# Patient Record
Sex: Female | Born: 1935 | Race: White | Hispanic: No | Smoking: Never smoker
Health system: Southern US, Community
[De-identification: ages and names within clinical notes are randomized; demographics above are authoritative.]

## PROBLEM LIST (undated history)

## (undated) DIAGNOSIS — I2510 Atherosclerotic heart disease of native coronary artery without angina pectoris: Secondary | ICD-10-CM

## (undated) DIAGNOSIS — I639 Cerebral infarction, unspecified: Secondary | ICD-10-CM

## (undated) DIAGNOSIS — N3281 Overactive bladder: Secondary | ICD-10-CM

## (undated) DIAGNOSIS — I340 Nonrheumatic mitral (valve) insufficiency: Secondary | ICD-10-CM

## (undated) DIAGNOSIS — R55 Syncope and collapse: Secondary | ICD-10-CM

## (undated) DIAGNOSIS — K219 Gastro-esophageal reflux disease without esophagitis: Secondary | ICD-10-CM

## (undated) DIAGNOSIS — E039 Hypothyroidism, unspecified: Secondary | ICD-10-CM

## (undated) HISTORY — PX: THYROIDECTOMY, PARTIAL: SHX18

## (undated) HISTORY — DX: Nonrheumatic mitral (valve) insufficiency: I34.0

## (undated) HISTORY — PX: CORNEAL TRANSPLANT: SHX108

## (undated) HISTORY — PX: TOTAL KNEE ARTHROPLASTY: SHX125

## (undated) HISTORY — PX: APPENDECTOMY: SHX54

## (undated) HISTORY — DX: Syncope and collapse: R55

## (undated) HISTORY — DX: Atherosclerotic heart disease of native coronary artery without angina pectoris: I25.10

---

## 2016-01-07 ENCOUNTER — Telehealth: Payer: Self-pay | Admitting: Cardiology

## 2016-01-07 NOTE — Telephone Encounter (Signed)
Patient came to office and completed Release to obtain her records from Dr [facility] ([place], [place]).  Faxed to [phone].  Faxed on 01/07/16.  Patient has appointment with Dr Antoine PocheHochrein on 01/13/16.

## 2016-01-12 ENCOUNTER — Telehealth: Payer: Self-pay | Admitting: Cardiology

## 2016-01-12 NOTE — Progress Notes (Signed)
Cardiology Office Note   Date:  01/13/2016   ID:  Christina Paymentlizabeth Czerniak, DOB [birth year], MRN [id number]  PCP:  Pcp Not In System  Cardiologist:   Rollene RotundaJames Monzerrat Wellen, MD   Chief Complaint  Patient presents with  . Chest Pain      History of Present Illness: Christina Ali is a 82 y.o. female who presents for evaluation of chest pain.  She is new to this area having moved [place] a couple of months ago to be near her grandson who is an MD at [employer].  She has a history of chest pain and syncope.  I did get records from a cardiologist in another city in [place].  She had a history of chest pain apparently and had a cath in 2012.  This showed mild nonobstructive disease.  She has mild MR.  She also apparently has orthostasis and is treated for this.  She has vasovagal syncope.  She is now living at a retirement home.  She does some exercise classes and does well with this.  She does have some chest pain.  This is mild.   It last most of the day when it happens.  She describes mid chest pain that does not radiate.  She has no associated symptoms. There has been no new shortness of breath, PND or orthopnea. There have been no reported palpitations, presyncope or syncope.      Past Medical History  Diagnosis Date  . CAD (coronary artery disease)     Cath 2012 right coronary 35-40% stenosis, LAD proximal 20% stenosis followed by mid 40% stenosis  . Mitral regurgitation     Mild  . Vasovagal syncope     Past Surgical History  Procedure Laterality Date  . Thyroidectomy, partial    . Total knee arthroplasty    . Appendectomy    . Corneal transplant       Current Outpatient Prescriptions  Medication Sig Dispense Refill  . aspirin 81 MG tablet Take 81 mg by mouth daily.    . Calcium-Vitamin D 600-200 MG-UNIT tablet Take 1 tablet by mouth daily.    Marland Kitchen. levothyroxine (SYNTHROID, LEVOTHROID) 50 MCG tablet Take 50 mcg by mouth daily before breakfast.    . metoprolol tartrate (LOPRESSOR) 25 MG tablet Take 1/2  tablets in morning and 1 at night    . mirabegron ER (MYRBETRIQ) 50 MG TB24 tablet Take 50 mg by mouth daily.    . Misc Natural Products (TART CHERRY ADVANCED PO) Take 1 capsule by mouth daily.    . Multiple Vitamins-Minerals (MULTIVITAMIN WITH MINERALS) tablet Take 1 tablet by mouth daily.    . nitroGLYCERIN (NITROSTAT) 0.4 MG SL tablet Place 0.4 mg under the tongue every 5 (five) minutes as needed for chest pain.    Marland Kitchen. omeprazole (PRILOSEC) 40 MG capsule Take 40 mg by mouth daily.    . trospium (SANCTURA) 20 MG tablet Take 20 mg by mouth daily.     No current facility-administered medications for this visit.    Allergies:   Review of patient's allergies indicates no known allergies.    Social History:  The patient  reports that she has never smoked. She has never used smokeless tobacco.   Family History:  The patient's family history includes Depression in her sister; Lymphoma in her son; Stroke in her mother.    ROS:  Please see the history of present illness.   Otherwise, review of systems are positive for none.   All other systems are  reviewed and negative.    PHYSICAL EXAM: VS:  BP 110/58 mmHg  Pulse 62  Ht 5' (1.524 m)  Wt 128 lb (58.06 kg)  BMI 25.00 kg/m2 , BMI Body mass index is 25 kg/(m^2). GENERAL:  Well appearing HEENT:  Pupils equal round and reactive, fundi not visualized, oral mucosa unremarkable NECK:  No jugular venous distention, waveform within normal limits, carotid upstroke brisk and symmetric, no bruits, no thyromegaly LYMPHATICS:  No cervical, inguinal adenopathy LUNGS:  Clear to auscultation bilaterally BACK:  No CVA tenderness CHEST:  Unremarkable HEART:  PMI not displaced or sustained,S1 and S2 within normal limits, no S3, no S4, no clicks, no rubs, no murmurs ABD:  Flat, positive bowel sounds normal in frequency in pitch, no bruits, no rebound, no guarding, no midline pulsatile mass, no hepatomegaly, no splenomegaly EXT:  2 plus pulses throughout, no  edema, no cyanosis no clubbing SKIN:  No rashes no nodules NEURO:  Cranial nerves II through XII grossly intact, motor grossly intact throughout PSYCH:  Cognitively intact, oriented to person place and time    EKG:  EKG is ordered today. The ekg ordered today demonstrates NSR, rate 63, axis WNL, intervals WNL, poor anterior R wave progression.  No acute ST T wave changes.     Recent Labs: No results found for requested labs within last 365 days.    Lipid Panel No results found for: CHOL, TRIG, HDL, CHOLHDL, VLDL, LDLCALC, LDLDIRECT    Wt Readings from Last 3 Encounters:  01/13/16 128 lb (58.06 kg)      Other studies Reviewed: Additional studies/ records that were reviewed today include: Outside cardiology records.. Review of the above records demonstrates:  Please see elsewhere in the note.     ASSESSMENT AND PLAN:  CHEST PAIN:  Her pain is somewhat atypical. I don't think further cardiovascular testing is indicated. She wonders if she might need more medication during the day. I'm going to simply switch the timing of her medication so she takes a higher dose metoprolol in the morning. I need to be careful because she seems to have had a history of problems with lower blood pressure and vasovagal syncope. I'll talk with her grandson to follow-up on symptoms and I will see her back in the office.   MR:  This was mild on exam and I will follow this clinically.  Current medicines are reviewed at length with the patient today.  The patient does not have concerns regarding medicines.  The following changes have been made:  no change  Labs/ tests ordered today include:  No orders of the defined types were placed in this encounter.     Disposition:   FU with me in 4 months.      Signed, Rollene Rotunda, MD  01/13/2016 5:23 PM    [facility]

## 2016-01-12 NOTE — Telephone Encounter (Signed)
Received records from [facility] ([place], [place]) as requested on 01/07/16 for appointment on 01/13/16 with Dr Antoine PocheHochrein.  [facility] for Dr Hochrein's schedule on 01/13/16. lp

## 2016-01-13 ENCOUNTER — Ambulatory Visit (INDEPENDENT_AMBULATORY_CARE_PROVIDER_SITE_OTHER): Payer: Medicare Other | Admitting: Cardiology

## 2016-01-13 ENCOUNTER — Encounter: Payer: Self-pay | Admitting: Cardiology

## 2016-01-13 VITALS — BP 110/58 | HR 62 | Ht 60.0 in | Wt 128.0 lb

## 2016-01-13 DIAGNOSIS — R072 Precordial pain: Secondary | ICD-10-CM

## 2016-01-13 NOTE — Patient Instructions (Signed)
Your physician recommends that you schedule a follow-up appointment in: 3 Months  Your physician has recommended you make the following change in your medication: Take 12.5 mg (1/2) tablets in the morning and 1 tablets(25mg ) at night

## 2016-02-16 ENCOUNTER — Emergency Department (HOSPITAL_COMMUNITY)
Admission: EM | Admit: 2016-02-16 | Discharge: 2016-02-16 | Disposition: A | Discharge status: Home / Self Care | Payer: Medicare Other | Attending: Emergency Medicine | Admitting: Emergency Medicine

## 2016-02-16 ENCOUNTER — Emergency Department (HOSPITAL_COMMUNITY): Payer: Medicare Other

## 2016-02-16 DIAGNOSIS — I340 Nonrheumatic mitral (valve) insufficiency: Secondary | ICD-10-CM | POA: Insufficient documentation

## 2016-02-16 DIAGNOSIS — Z7982 Long term (current) use of aspirin: Secondary | ICD-10-CM | POA: Diagnosis not present

## 2016-02-16 DIAGNOSIS — R05 Cough: Secondary | ICD-10-CM | POA: Diagnosis not present

## 2016-02-16 DIAGNOSIS — Z79899 Other long term (current) drug therapy: Secondary | ICD-10-CM | POA: Insufficient documentation

## 2016-02-16 DIAGNOSIS — I2510 Atherosclerotic heart disease of native coronary artery without angina pectoris: Secondary | ICD-10-CM | POA: Diagnosis not present

## 2016-02-16 DIAGNOSIS — R059 Cough, unspecified: Secondary | ICD-10-CM

## 2016-02-16 DIAGNOSIS — R0902 Hypoxemia: Secondary | ICD-10-CM | POA: Diagnosis present

## 2016-02-16 LAB — BASIC METABOLIC PANEL
ANION GAP: 10 (ref 5–15)
BUN: 19 mg/dL (ref 6–20)
CALCIUM: 8.8 mg/dL — AB (ref 8.9–10.3)
CO2: 23 mmol/L (ref 22–32)
CREATININE: 0.77 mg/dL (ref 0.44–1.00)
Chloride: 93 mmol/L — ABNORMAL LOW (ref 101–111)
GFR calc Af Amer: >60 mL/min (ref >60)
GFR calc non Af Amer: >60 mL/min (ref >60)
GLUCOSE: 104 mg/dL — AB (ref 65–99)
Potassium: 4.3 mmol/L (ref 3.5–5.1)
Sodium: 126 mmol/L — ABNORMAL LOW (ref 135–145)

## 2016-02-16 LAB — CBC WITH DIFFERENTIAL/PLATELET
BASOS PCT: 0 %
Basophils Absolute: 0 10*3/uL (ref 0.0–0.1)
EOS PCT: 4 %
Eosinophils Absolute: 0.3 10*3/uL (ref 0.0–0.7)
HEMATOCRIT: 36.1 % (ref 36.0–46.0)
Hemoglobin: 12.3 g/dL (ref 12.0–15.0)
Lymphocytes Relative: 22 %
Lymphs Abs: 1.5 10*3/uL (ref 0.7–4.0)
MCH: 29.1 pg (ref 26.0–34.0)
MCHC: 34.1 g/dL (ref 30.0–36.0)
MCV: 85.5 fL (ref 78.0–100.0)
MONO ABS: 0.6 10*3/uL (ref 0.1–1.0)
MONOS PCT: 9 %
NEUTROS ABS: 4.4 10*3/uL (ref 1.7–7.7)
Neutrophils Relative %: 65 %
Platelets: 168 10*3/uL (ref 150–400)
RBC: 4.22 MIL/uL (ref 3.87–5.11)
RDW: 14.5 % (ref 11.5–15.5)
WBC: 6.8 10*3/uL (ref 4.0–10.5)

## 2016-02-16 LAB — I-STAT CG4 LACTIC ACID, ED: Lactic Acid, Venous: 1.35 mmol/L (ref 0.5–2.0)

## 2016-02-16 MED ORDER — IPRATROPIUM-ALBUTEROL 0.5-2.5 (3) MG/3ML IN SOLN
3.0000 mL | Freq: Once | RESPIRATORY_TRACT | Status: AC
  Administered 2016-02-16: 3 mL via RESPIRATORY_TRACT
  Filled 2016-02-16: qty 3

## 2016-02-16 MED ORDER — IPRATROPIUM-ALBUTEROL 0.5-2.5 (3) MG/3ML IN SOLN: 3.0000 mL | Freq: Four times a day (QID) | RESPIRATORY_TRACT | Status: DC | PRN

## 2016-02-16 NOTE — ED Notes (Signed)
Pt reports to the ED for eval of trapped mucous plug. Pt is from [facility]. Recently had PNA and has been having a continued productive cough. Approx occurred 1 hour ago she was coughing and a mucous plus got stuck in her throat and she has been unable to cough it up. O2 sats WNL without coughing but when coughing O2 sats decrease to 70s per EMS. They attempted suctioning but were unable to remove it. Pt A&Ox4, resp e/u at this time, skin warm and dry.

## 2016-02-16 NOTE — Discharge Planning (Signed)
Discharging patient back to [facility].  Nebulizer machine ordered.  [facility]  States will take care of it.

## 2016-02-16 NOTE — Discharge Instructions (Signed)

## 2016-02-16 NOTE — ED Notes (Signed)
Pt statesshe understands instructions. 

## 2016-02-16 NOTE — ED Notes (Signed)
Pt ambulates to [facility].

## 2016-02-16 NOTE — ED Notes (Signed)
Pt returns to bed [facility] here to transport.

## 2016-02-16 NOTE — ED Provider Notes (Signed)
CSN: [id number]     Arrival date & time 02/16/16  1140 History   First MD Initiated Contact with Patient 02/16/16 1151     Chief Complaint  Patient presents with  . Foreign Body     (Consider location/radiation/quality/duration/timing/severity/associated sxs/prior Treatment) HPI   Christina Ali is a 82 y.o. female who presents for evaluation of difficulty breathing, and an episode of hypoxia. She has apparently been coughing this morning and feels like she has some mucus, that she is unable to move into her oropharynx. She was well yesterday and had a good night sleeping last night. During transport, by EMS , her oxygen saturations were transiently in the 70s by their report. They attempted to suction her orally, but do not feel that they were able to remove anything. There is no history consistent with a food bolus obstruction. Patient is not having chest pain, nausea, vomiting, or new weakness and dizziness. She denies other recent illnesses. There are no other known modifying factors.   Past Medical History  Diagnosis Date  . CAD (coronary artery disease)     Cath 2012 right coronary 35-40% stenosis, LAD proximal 20% stenosis followed by mid 40% stenosis  . Mitral regurgitation     Mild  . Vasovagal syncope    Past Surgical History  Procedure Laterality Date  . Thyroidectomy, partial    . Total knee arthroplasty    . Appendectomy    . Corneal transplant     Family History  Problem Relation Age of Onset  . Stroke Mother   . Depression Sister   . Lymphoma Son    Social History  Substance Use Topics  . Smoking status: Never Smoker   . Smokeless tobacco: Never Used  . Alcohol Use: Not on file   OB History    No data available     Review of Systems    Allergies  Review of patient's allergies indicates no known allergies.  Home Medications   Prior to Admission medications   Medication Sig Start Date End Date Taking? Authorizing Provider  aspirin 81 MG tablet  Take 81 mg by mouth daily.   Yes Historical Provider, MD  Calcium-Vitamin D 600-200 MG-UNIT tablet Take 1 tablet by mouth daily.   Yes Historical Provider, MD  Glucos-Chond-Hyal Ac-Ca Fructo (MOVE FREE JOINT HEALTH ADVANCE PO) Take 1 tablet by mouth daily.   Yes Historical Provider, MD  guaifenesin (ROBITUSSIN) 100 MG/5ML syrup Take 200 mg by mouth 4 (four) times daily. For cough   Yes Historical Provider, MD  levothyroxine (SYNTHROID, LEVOTHROID) 50 MCG tablet Take 50 mcg by mouth daily before breakfast.   Yes Historical Provider, MD  Loratadine-Pseudoephedrine (CLARITIN-D 12 HOUR PO) Take 1 tablet by mouth daily.   Yes Historical Provider, MD  metoprolol tartrate (LOPRESSOR) 25 MG tablet Take 12.5-25 mg by mouth 2 (two) times daily. Take 1/2 tablets in morning and 1 at night   Yes Historical Provider, MD  mirabegron ER (MYRBETRIQ) 50 MG TB24 tablet Take 50 mg by mouth daily.   Yes Historical Provider, MD  omeprazole (PRILOSEC) 40 MG capsule Take 40 mg by mouth daily.   Yes Historical Provider, MD  prednisoLONE acetate (PRED MILD) 0.12 % ophthalmic suspension Place 1 drop into the right eye at bedtime.   Yes Historical Provider, MD  Prenatal Vit-Fe Fumarate-FA (MULTIVITAMIN-PRENATAL) 27-0.8 MG TABS tablet Take 1 tablet by mouth daily at 12 noon.   Yes Historical Provider, MD  trospium (SANCTURA) 20 MG tablet Take 60 mg  by mouth daily.    Yes Historical Provider, MD  ipratropium-albuterol (DUONEB) 0.5-2.5 (3) MG/3ML SOLN Take 3 mLs by nebulization every 6 (six) hours as needed (Cough or congestion). 02/16/16   Mancel BaleElliott Ferol Laiche, MD  Misc Natural Products (TART CHERRY ADVANCED PO) Take 1 capsule by mouth daily.    Historical Provider, MD  Multiple Vitamins-Minerals (MULTIVITAMIN WITH MINERALS) tablet Take 1 tablet by mouth daily.    Historical Provider, MD  nitroGLYCERIN (NITROSTAT) 0.4 MG SL tablet Place 0.4 mg under the tongue every 5 (five) minutes as needed for chest pain.    Historical Provider, MD    BP 110/76 mmHg  Pulse 78  Temp(Src) 98.1 F (36.7 C) (Oral)  Resp 18  SpO2 95% Physical Exam  Constitutional: She is oriented to person, place, and time. She appears well-developed and well-nourished.  HENT:  Head: Normocephalic and atraumatic.  Right Ear: External ear normal.  Left Ear: External ear normal.  Eyes: Conjunctivae and EOM are normal. Pupils are equal, round, and reactive to light.  Neck: Normal range of motion and phonation normal. Neck supple.  Cardiovascular: Normal rate, regular rhythm and normal heart sounds.   Pulmonary/Chest: Effort normal and breath sounds normal. No respiratory distress. She has no wheezes. She exhibits no bony tenderness.  No increased work of breathing. Symmetric and normal air movement bilaterally.  Abdominal: Soft. There is no tenderness.  Musculoskeletal: Normal range of motion. She exhibits no edema or tenderness.  Neurological: She is alert and oriented to person, place, and time. No cranial nerve deficit or sensory deficit. She exhibits normal muscle tone. Coordination normal.  No dysarthria and aphasia or nystagmus. She is lucid.  Skin: Skin is warm, dry and intact.  Psychiatric: She has a normal mood and affect. Her behavior is normal. Judgment and thought content normal.  Nursing note and vitals reviewed.   ED Course  Procedures (including critical care time)  Initial clinical impression- nonspecific transient respiratory distress associated with coughing, and a sensation of mucus plugging. This resolved in the emergency department. Possible bronchospasm-multiple potential causes, versus bronchitis, as sources. Initial evaluation with imaging, then will reassess.  Medications  ipratropium-albuterol (DUONEB) 0.5-2.5 (3) MG/3ML nebulizer solution 3 mL (3 mLs Nebulization Given 02/16/16 1309)    Patient Vitals for the past 24 hrs:  BP Temp Temp src Pulse Resp SpO2  02/16/16 1530 110/76 mmHg - - 78 18 95 %  02/16/16 1515 119/78  mmHg - - 79 21 97 %  02/16/16 1500 118/87 mmHg - - 80 18 99 %  02/16/16 1445 127/76 mmHg - - 83 20 99 %  02/16/16 1430 123/61 mmHg - - 84 20 99 %  02/16/16 1345 138/88 mmHg - - 98 21 94 %  02/16/16 1330 118/70 mmHg - - 84 14 97 %  02/16/16 1300 128/76 mmHg - - 79 18 97 %  02/16/16 1215 119/75 mmHg - - 74 19 95 %  02/16/16 1200 124/87 mmHg - - 77 20 97 %  02/16/16 1147 129/79 mmHg 98.1 F (36.7 C) Oral 79 18 100 %    1:12 PM Reevaluation with update and discussion. After initial assessment and treatment, an updated evaluation reveals She remains comfortable, saturation on room air. Findings discussed with family member, proceed with further evaluation to assess the abnormal chest x-ray, with labs. Also give trial of DuoNeb, to hopefully improve her ability to move secretions. Christina Ali    Labs Review Labs Reviewed  BASIC METABOLIC PANEL - Abnormal; Notable for  the following:    Sodium 126 (*)    Chloride 93 (*)    Glucose, Bld 104 (*)    Calcium 8.8 (*)    All other components within normal limits  CBC WITH DIFFERENTIAL/PLATELET  I-STAT CG4 LACTIC ACID, ED    Imaging Review Dg Chest 2 View  02/16/2016  CLINICAL DATA:  Cough, congestion for several weeks. Recent pneumonia last month. EXAM: CHEST  2 VIEW COMPARISON:  None. FINDINGS: Heart is normal size. Mild airspace opacity posteriorly at the left base. Right lung is clear. No effusions. No acute bony abnormality. IMPRESSION: Left lower lobe atelectasis or early infiltrate. Electronically Signed   By: Charlett Nose M.D.   On: 02/16/2016 12:37   I have personally reviewed and evaluated these images and lab results as part of my medical decision-making.   EKG Interpretation None      MDM   Final diagnoses:  Cough    Nonspecific cough, likely related to inspissated mucus. Doubt bronchitis, pneumonia, metabolic instability or impending vascular cause.  Nursing Notes Reviewed/ Care Coordinated Applicable Imaging  Reviewed Interpretation of Laboratory Data incorporated into ED treatment  The patient appears reasonably screened and/or stabilized for discharge and I doubt any other medical condition or other [facility] requiring further screening, evaluation, or treatment in the ED at this time prior to discharge.  Plan: Home Medications- DuonebQID; Home Treatments- increase oral fluids; return here if the recommended treatment, does not improve the symptoms; Recommended follow up- PCP 1 week     Mancel Bale, MD 02/16/16 1558

## 2016-02-23 ENCOUNTER — Emergency Department (HOSPITAL_COMMUNITY): Payer: Medicare Other

## 2016-02-23 ENCOUNTER — Observation Stay (HOSPITAL_COMMUNITY)
Admission: EM | Admit: 2016-02-23 | Discharge: 2016-02-24 | Disposition: A | Discharge status: Hospice | Payer: Medicare Other | Attending: Internal Medicine | Admitting: Internal Medicine

## 2016-02-23 ENCOUNTER — Encounter (HOSPITAL_COMMUNITY): Payer: Self-pay

## 2016-02-23 DIAGNOSIS — W19XXXA Unspecified fall, initial encounter: Secondary | ICD-10-CM | POA: Insufficient documentation

## 2016-02-23 DIAGNOSIS — I639 Cerebral infarction, unspecified: Secondary | ICD-10-CM | POA: Diagnosis present

## 2016-02-23 DIAGNOSIS — E871 Hypo-osmolality and hyponatremia: Secondary | ICD-10-CM | POA: Insufficient documentation

## 2016-02-23 DIAGNOSIS — Z96659 Presence of unspecified artificial knee joint: Secondary | ICD-10-CM | POA: Insufficient documentation

## 2016-02-23 DIAGNOSIS — I63511 Cerebral infarction due to unspecified occlusion or stenosis of right middle cerebral artery: Principal | ICD-10-CM | POA: Insufficient documentation

## 2016-02-23 DIAGNOSIS — I340 Nonrheumatic mitral (valve) insufficiency: Secondary | ICD-10-CM | POA: Insufficient documentation

## 2016-02-23 DIAGNOSIS — R509 Fever, unspecified: Secondary | ICD-10-CM

## 2016-02-23 DIAGNOSIS — K219 Gastro-esophageal reflux disease without esophagitis: Secondary | ICD-10-CM | POA: Diagnosis not present

## 2016-02-23 DIAGNOSIS — I63311 Cerebral infarction due to thrombosis of right middle cerebral artery: Secondary | ICD-10-CM | POA: Diagnosis not present

## 2016-02-23 DIAGNOSIS — Z515 Encounter for palliative care: Secondary | ICD-10-CM | POA: Insufficient documentation

## 2016-02-23 DIAGNOSIS — E039 Hypothyroidism, unspecified: Secondary | ICD-10-CM | POA: Diagnosis not present

## 2016-02-23 DIAGNOSIS — I2510 Atherosclerotic heart disease of native coronary artery without angina pectoris: Secondary | ICD-10-CM | POA: Insufficient documentation

## 2016-02-23 DIAGNOSIS — Z7982 Long term (current) use of aspirin: Secondary | ICD-10-CM | POA: Diagnosis not present

## 2016-02-23 DIAGNOSIS — Z66 Do not resuscitate: Secondary | ICD-10-CM | POA: Diagnosis not present

## 2016-02-23 HISTORY — DX: Overactive bladder: N32.81

## 2016-02-23 HISTORY — DX: Gastro-esophageal reflux disease without esophagitis: K21.9

## 2016-02-23 HISTORY — DX: Hypothyroidism, unspecified: E03.9

## 2016-02-23 HISTORY — DX: Cerebral infarction, unspecified: I63.9

## 2016-02-23 LAB — CBC
HCT: 39.1 % (ref 36.0–46.0)
Hemoglobin: 13.2 g/dL (ref 12.0–15.0)
MCH: 28.6 pg (ref 26.0–34.0)
MCHC: 33.8 g/dL (ref 30.0–36.0)
MCV: 84.8 fL (ref 78.0–100.0)
PLATELETS: 177 10*3/uL (ref 150–400)
RBC: 4.61 MIL/uL (ref 3.87–5.11)
RDW: 14.4 % (ref 11.5–15.5)
WBC: 6.6 10*3/uL (ref 4.0–10.5)

## 2016-02-23 LAB — I-STAT CHEM 8, ED
BUN: 11 mg/dL (ref 6–20)
CALCIUM ION: 0.99 mmol/L — AB (ref 1.13–1.30)
CHLORIDE: 92 mmol/L — AB (ref 101–111)
Creatinine, Ser: 0.6 mg/dL (ref 0.44–1.00)
Glucose, Bld: 122 mg/dL — ABNORMAL HIGH (ref 65–99)
HEMATOCRIT: 45 % (ref 36.0–46.0)
Hemoglobin: 15.3 g/dL — ABNORMAL HIGH (ref 12.0–15.0)
POTASSIUM: 3.7 mmol/L (ref 3.5–5.1)
SODIUM: 128 mmol/L — AB (ref 135–145)
TCO2: 24 mmol/L (ref 0–100)

## 2016-02-23 LAB — URINALYSIS, ROUTINE W REFLEX MICROSCOPIC
BILIRUBIN URINE: NEGATIVE
Glucose, UA: NEGATIVE mg/dL
Ketones, ur: 15 mg/dL — AB
Leukocytes, UA: NEGATIVE
Nitrite: NEGATIVE
PH: 7.5 (ref 5.0–8.0)
Protein, ur: NEGATIVE mg/dL
SPECIFIC GRAVITY, URINE: 1.009 (ref 1.005–1.030)

## 2016-02-23 LAB — DIFFERENTIAL
BASOS PCT: 0 %
Basophils Absolute: 0 10*3/uL (ref 0.0–0.1)
Eosinophils Absolute: 0 10*3/uL (ref 0.0–0.7)
Eosinophils Relative: 0 %
Lymphocytes Relative: 13 %
Lymphs Abs: 0.8 10*3/uL (ref 0.7–4.0)
MONO ABS: 0.6 10*3/uL (ref 0.1–1.0)
Monocytes Relative: 8 %
Neutro Abs: 5.2 10*3/uL (ref 1.7–7.7)
Neutrophils Relative %: 79 %

## 2016-02-23 LAB — RAPID URINE DRUG SCREEN, HOSP PERFORMED
AMPHETAMINES: NOT DETECTED
Barbiturates: NOT DETECTED
Benzodiazepines: NOT DETECTED
COCAINE: NOT DETECTED
OPIATES: NOT DETECTED
Tetrahydrocannabinol: NOT DETECTED

## 2016-02-23 LAB — COMPREHENSIVE METABOLIC PANEL
ALT: 21 U/L (ref 14–54)
AST: 48 U/L — ABNORMAL HIGH (ref 15–41)
Albumin: 4 g/dL (ref 3.5–5.0)
Alkaline Phosphatase: 61 U/L (ref 38–126)
Anion gap: 11 (ref 5–15)
BUN: 9 mg/dL (ref 6–20)
CHLORIDE: 94 mmol/L — AB (ref 101–111)
CO2: 22 mmol/L (ref 22–32)
CREATININE: 0.82 mg/dL (ref 0.44–1.00)
Calcium: 9 mg/dL (ref 8.9–10.3)
GFR calc non Af Amer: >60 mL/min (ref >60)
Glucose, Bld: 124 mg/dL — ABNORMAL HIGH (ref 65–99)
POTASSIUM: 3.8 mmol/L (ref 3.5–5.1)
SODIUM: 127 mmol/L — AB (ref 135–145)
Total Bilirubin: 1 mg/dL (ref 0.3–1.2)
Total Protein: 7.3 g/dL (ref 6.5–8.1)

## 2016-02-23 LAB — URINE MICROSCOPIC-ADD ON

## 2016-02-23 LAB — I-STAT TROPONIN, ED: Troponin i, poc: 0.08 ng/mL (ref 0.00–0.08)

## 2016-02-23 LAB — PROTIME-INR
INR: 1.14 (ref 0.00–1.49)
PROTHROMBIN TIME: 14.8 s (ref 11.6–15.2)

## 2016-02-23 LAB — APTT: APTT: 33 s (ref 24–37)

## 2016-02-23 LAB — ETHANOL

## 2016-02-23 MED ORDER — GLYCOPYRROLATE 0.2 MG/ML IJ SOLN: 0.2000 mg | INTRAMUSCULAR | Status: DC | PRN

## 2016-02-23 MED ORDER — HALOPERIDOL LACTATE 5 MG/ML IJ SOLN: 0.5000 mg | INTRAMUSCULAR | Status: DC | PRN

## 2016-02-23 MED ORDER — LORAZEPAM 2 MG/ML PO CONC: 1.0000 mg | ORAL | Status: DC | PRN

## 2016-02-23 MED ORDER — OXYBUTYNIN CHLORIDE 5 MG PO TABS: 2.5000 mg | ORAL_TABLET | Freq: Four times a day (QID) | ORAL | Status: DC | PRN

## 2016-02-23 MED ORDER — ONDANSETRON 4 MG PO TBDP: 4.0000 mg | ORAL_TABLET | Freq: Four times a day (QID) | ORAL | Status: DC | PRN

## 2016-02-23 MED ORDER — ACETAMINOPHEN 650 MG RE SUPP
650.0000 mg | Freq: Once | RECTAL | Status: AC
  Administered 2016-02-23: 650 mg via RECTAL
  Filled 2016-02-23: qty 1

## 2016-02-23 MED ORDER — WHITE PETROLATUM GEL
Status: AC
  Administered 2016-02-24: 07:00:00
  Filled 2016-02-23: qty 1

## 2016-02-23 MED ORDER — BIOTENE DRY MOUTH MT LIQD: 15.0000 mL | OROMUCOSAL | Status: DC | PRN

## 2016-02-23 MED ORDER — ONDANSETRON HCL 4 MG/2ML IJ SOLN: 4.0000 mg | Freq: Four times a day (QID) | INTRAMUSCULAR | Status: DC | PRN

## 2016-02-23 MED ORDER — LORAZEPAM 1 MG PO TABS: 1.0000 mg | ORAL_TABLET | ORAL | Status: DC | PRN

## 2016-02-23 MED ORDER — CHLORPROMAZINE HCL 25 MG/ML IJ SOLN
12.5000 mg | Freq: Four times a day (QID) | INTRAMUSCULAR | Status: DC | PRN
  Filled 2016-02-23: qty 0.5

## 2016-02-23 MED ORDER — DIPHENHYDRAMINE HCL 50 MG/ML IJ SOLN: 12.5000 mg | INTRAMUSCULAR | Status: DC | PRN

## 2016-02-23 MED ORDER — POLYVINYL ALCOHOL 1.4 % OP SOLN
1.0000 [drp] | Freq: Four times a day (QID) | OPHTHALMIC | Status: DC | PRN
  Filled 2016-02-23: qty 15

## 2016-02-23 MED ORDER — GLYCOPYRROLATE 1 MG PO TABS
1.0000 mg | ORAL_TABLET | ORAL | Status: DC | PRN
  Filled 2016-02-23: qty 1

## 2016-02-23 MED ORDER — LORAZEPAM 2 MG/ML IJ SOLN
1.0000 mg | INTRAMUSCULAR | Status: DC | PRN
  Administered 2016-02-24: 1 mg via INTRAVENOUS

## 2016-02-23 MED ORDER — LORAZEPAM 2 MG/ML IJ SOLN
1.0000 mg | INTRAMUSCULAR | Status: DC | PRN
  Filled 2016-02-23: qty 1

## 2016-02-23 MED ORDER — HALOPERIDOL LACTATE 2 MG/ML PO CONC
0.5000 mg | ORAL | Status: DC | PRN
  Filled 2016-02-23: qty 0.3

## 2016-02-23 MED ORDER — DEXTROSE-NACL 5-0.45 % IV SOLN
INTRAVENOUS | Status: DC | PRN
  Administered 2016-02-23: via INTRAVENOUS

## 2016-02-23 MED ORDER — DEXTROSE 5 % IV SOLN
1.0000 mg/h | INTRAVENOUS | Status: DC | PRN
  Filled 2016-02-23: qty 10

## 2016-02-23 MED ORDER — HALOPERIDOL 1 MG PO TABS: 0.5000 mg | ORAL_TABLET | ORAL | Status: DC | PRN

## 2016-02-23 MED ORDER — MORPHINE BOLUS VIA INFUSION
1.0000 mg | INTRAVENOUS | Status: DC | PRN
  Filled 2016-02-23: qty 4

## 2016-02-23 MED ORDER — ACETAMINOPHEN 325 MG PO TABS: 650.0000 mg | ORAL_TABLET | Freq: Four times a day (QID) | ORAL | Status: DC | PRN

## 2016-02-23 MED ORDER — ACETAMINOPHEN 650 MG RE SUPP: 650.0000 mg | Freq: Four times a day (QID) | RECTAL | Status: DC | PRN

## 2016-02-23 NOTE — Progress Notes (Signed)
Per [place] with [facility], [facility] does not a bed available today but will have one tomorrow. Per Inetta Fermoina, RN at [facility], Patient can return to facility with hospice but will need equipment in place prior to return. CSW staffed with RN CM who will consult with hospice regarding getting equipment. CSW will continue to follow.      Lance MussAshley Gardner,MSW, LCSW [facility] ED/47M Clinical Social Worker [phone]

## 2016-02-23 NOTE — ED Notes (Signed)
Pt given mouth swabs for family to wet mouth to keep her comfortable.

## 2016-02-23 NOTE — Discharge Planning (Signed)
Spoke with Kennyth ArnoldStacy of [facility] [place] [phone]); pt family [facility].  Kennyth ArnoldStacy will remain in touch with the family.

## 2016-02-23 NOTE — Discharge Planning (Signed)
EDCM spoke with Cleotis Lemaina Von Cannon of [facility] [phone]) who states they are happy to take resident back with hospice.  EDCM contacted Stacy of hospice and [facility] of [place] [phone]) to coordinate services and equipment for discharge from ED today.

## 2016-02-23 NOTE — Discharge Planning (Signed)
EDCM consulted to assist with residential hospice placement.  EDCM placed call to [facility].  Awaiting call from intake RN for next steps.

## 2016-02-23 NOTE — ED Notes (Signed)
Pt returns from CT.

## 2016-02-23 NOTE — ED Provider Notes (Signed)
CSN: [id number]     Arrival date & time 02/23/16  0710 History   First MD Initiated Contact with Patient 02/23/16 0719     Chief Complaint  Patient presents with  . Fall  . Stroke Symptoms     Level V caveat due to altered mental status. Patient is a 82 y.o. female presenting with fall. The history is provided by the patient and a relative.  Fall   patient was reportedly found [facility] the floor the nursing home. Unknown last normal time. Normally is alert and rather warranted. Recent visit to the ER she walked to the bathroom. Now she can follow some commands but can minimally speak. She appears to been incontinent of urine. She really cannot provide history.  Past Medical History  Diagnosis Date  . CAD (coronary artery disease)     Cath 2012 right coronary 35-40% stenosis, LAD proximal 20% stenosis followed by mid 40% stenosis  . Mitral regurgitation     Mild  . Vasovagal syncope   . Hypothyroidism   . Overactive bladder   . GERD (gastroesophageal reflux disease)    Past Surgical History  Procedure Laterality Date  . Thyroidectomy, partial    . Total knee arthroplasty    . Appendectomy    . Corneal transplant     Family History  Problem Relation Age of Onset  . Stroke Mother   . Depression Sister   . Lymphoma Son    Social History  Substance Use Topics  . Smoking status: Never Smoker   . Smokeless tobacco: Never Used  . Alcohol Use: None   OB History    No data available     Review of Systems  Unable to perform ROS: Mental status change      Allergies  Review of patient's allergies indicates no known allergies.  Home Medications   Prior to Admission medications   Medication Sig Start Date End Date Taking? Authorizing Provider  aspirin 81 MG tablet Take 81 mg by mouth daily.   Yes Historical Provider, MD  Calcium-Vitamin D 600-200 MG-UNIT tablet Take 1 tablet by mouth daily.   Yes Historical Provider, MD  Glucos-Chond-Hyal Ac-Ca Fructo (MOVE FREE JOINT HEALTH  ADVANCE PO) Take 1 tablet by mouth daily.   Yes Historical Provider, MD  guaifenesin (ROBITUSSIN) 100 MG/5ML syrup Take 200 mg by mouth 4 (four) times daily. For cough   Yes Historical Provider, MD  ipratropium-albuterol (DUONEB) 0.5-2.5 (3) MG/3ML SOLN Take 3 mLs by nebulization every 6 (six) hours as needed (Cough or congestion). 02/16/16  Yes Mancel Bale, MD  levothyroxine (SYNTHROID, LEVOTHROID) 50 MCG tablet Take 50 mcg by mouth daily before breakfast.   Yes Historical Provider, MD  Loratadine-Pseudoephedrine (CLARITIN-D 12 HOUR PO) Take 1 tablet by mouth daily.   Yes Historical Provider, MD  metoprolol tartrate (LOPRESSOR) 25 MG tablet Take 12.5-25 mg by mouth 2 (two) times daily. Take 1/2 tablets in morning and 1 at night   Yes Historical Provider, MD  mirabegron ER (MYRBETRIQ) 50 MG TB24 tablet Take 50 mg by mouth daily.   Yes Historical Provider, MD  Multiple Vitamins-Minerals (MULTIVITAMIN WITH MINERALS) tablet Take 1 tablet by mouth daily.   Yes Historical Provider, MD  nitroGLYCERIN (NITROSTAT) 0.4 MG SL tablet Place 0.4 mg under the tongue every 5 (five) minutes as needed for chest pain.   Yes Historical Provider, MD  omeprazole (PRILOSEC) 40 MG capsule Take 40 mg by mouth daily.   Yes Historical Provider, MD  prednisoLONE acetate (  PRED MILD) 0.12 % ophthalmic suspension Place 1 drop into the right eye at bedtime.   Yes Historical Provider, MD  Prenatal Vit-Fe Fumarate-FA (MULTIVITAMIN-PRENATAL) 27-0.8 MG TABS tablet Take 1 tablet by mouth daily at 12 noon.   Yes Historical Provider, MD  trospium (SANCTURA) 20 MG tablet Take 60 mg by mouth daily.    Yes Historical Provider, MD   BP 130/80 mmHg  Pulse 92  Temp(Src) 98.1 F (36.7 C) (Axillary)  Resp 24  Ht 5\' 2"  (1.575 m)  Wt 130 lb (58.968 kg)  BMI 23.77 kg/m2  SpO2 97% Physical Exam  Constitutional: She appears well-developed.  HENT:  Head: Atraumatic.  Mucous membranes are dry.  Eyes:  Pupils are somewhat reactive but eyes  appear deviated to the right. She will look to the midline but I have not seen across to the left side.  Neck:  Cervical collar in place. No midline tenderness.  Cardiovascular:  Mild tachycardia  Pulmonary/Chest: No respiratory distress.  Abdominal: There is no tenderness.  Musculoskeletal: She exhibits no edema.  Neurological:  Patient's been incontinent of urine. I still see deviated to right. Appears to have some left-sided neglect. Will squeeze to command and wiggle toes on right side. Right side somewhat spasmed. Patient has very slurred speech cannot understand her.  Skin: Skin is warm. No erythema.    ED Course  Procedures (including critical care time) Labs Review Labs Reviewed  COMPREHENSIVE METABOLIC PANEL - Abnormal; Notable for the following:    Sodium 127 (*)    Chloride 94 (*)    Glucose, Bld 124 (*)    AST 48 (*)    All other components within normal limits  URINALYSIS, ROUTINE W REFLEX MICROSCOPIC (NOT AT [facility]) - Abnormal; Notable for the following:    Hgb urine dipstick MODERATE (*)    Ketones, ur 15 (*)    All other components within normal limits  URINE MICROSCOPIC-ADD ON - Abnormal; Notable for the following:    Squamous Epithelial / LPF 0-5 (*)    Bacteria, UA RARE (*)    All other components within normal limits  I-STAT CHEM 8, ED - Abnormal; Notable for the following:    Sodium 128 (*)    Chloride 92 (*)    Glucose, Bld 122 (*)    Calcium, Ion 0.99 (*)    Hemoglobin 15.3 (*)    All other components within normal limits  ETHANOL  PROTIME-INR  APTT  CBC  DIFFERENTIAL  URINE RAPID DRUG SCREEN, HOSP PERFORMED  I-STAT TROPOININ, ED    Imaging Review Ct Head Wo Contrast  02/23/2016  CLINICAL DATA:  Pain following fall.  Chronic altered mental status EXAM: CT HEAD WITHOUT CONTRAST CT CERVICAL SPINE WITHOUT CONTRAST TECHNIQUE: Multidetector CT imaging of the head and cervical spine was performed following the standard protocol without intravenous  contrast. Multiplanar CT image reconstructions of the cervical spine were also generated. COMPARISON:  None. FINDINGS: CT HEAD FINDINGS There is mild diffuse atrophy. There is decreased attenuation throughout the inferior posterior right frontal lobe and essentially the entire right temporal lobe consistent with an acute middle cerebral artery distribution infarct. There is decreased attenuation is well throughout the lateral right basal ganglia region including the right claustrum, extreme capsule, and insular cortex. Localized cytotoxic edema is noted in these regions. There is no mass lesion evident. No hemorrhage or midline shift. No subdural or epidural fluid collections are evident. Elsewhere there is small vessel disease in the centra semiovale bilaterally. The  bony calvarium appears intact. The mastoid air cells are clear. There is opacification in the right frontal sinus superiorly as well as mild mucosal thickening in several ethmoid air cells bilaterally. Note that there is diffuse increased attenuation in the right middle cerebral artery compared to the left. CT CERVICAL SPINE FINDINGS There is no demonstrable fracture. There is slight anterolisthesis of C4 on C5, C6 on C7, and C7 on T1, likely due to underlying spondylosis. Mild spondylolisthesis at T1-2 and T2-3 are also felt to be due to underlying spondylosis. There is moderate disc space narrowing at C4-5 and C5-6 with severe narrowing at C6-7, C7-T1, and T1-2. There is facet osteoarthritic change and hypertrophy at most levels bilaterally. Exit foraminal narrowing is fairly marked at C4-5 on the left, C5-6 bilaterally, and C6-7 bilaterally. There are foci of calcification in both carotid arteries, more on the left than on the right. There is scarring in each lung apex. IMPRESSION: CT head: Acute infarct involving much of the right middle cerebral artery distribution. No acute hemorrhage is evident. There is atrophy with small vessel disease. No  extra-axial fluid collection or midline shift. CT cervical spine: No fracture. Mild spondylolisthesis at multiple levels, felt to be due to extensive underlying spondylosis. There is multilevel spondylosis and osteoarthritic change. There are foci of carotid artery calcification bilaterally, more severe on the left than on the right. Electronically Signed   By: Bretta Bang III M.D.   On: 02/23/2016 08:41   Ct Cervical Spine Wo Contrast  02/23/2016  CLINICAL DATA:  Pain following fall.  Chronic altered mental status EXAM: CT HEAD WITHOUT CONTRAST CT CERVICAL SPINE WITHOUT CONTRAST TECHNIQUE: Multidetector CT imaging of the head and cervical spine was performed following the standard protocol without intravenous contrast. Multiplanar CT image reconstructions of the cervical spine were also generated. COMPARISON:  None. FINDINGS: CT HEAD FINDINGS There is mild diffuse atrophy. There is decreased attenuation throughout the inferior posterior right frontal lobe and essentially the entire right temporal lobe consistent with an acute middle cerebral artery distribution infarct. There is decreased attenuation is well throughout the lateral right basal ganglia region including the right claustrum, extreme capsule, and insular cortex. Localized cytotoxic edema is noted in these regions. There is no mass lesion evident. No hemorrhage or midline shift. No subdural or epidural fluid collections are evident. Elsewhere there is small vessel disease in the centra semiovale bilaterally. The bony calvarium appears intact. The mastoid air cells are clear. There is opacification in the right frontal sinus superiorly as well as mild mucosal thickening in several ethmoid air cells bilaterally. Note that there is diffuse increased attenuation in the right middle cerebral artery compared to the left. CT CERVICAL SPINE FINDINGS There is no demonstrable fracture. There is slight anterolisthesis of C4 on C5, C6 on C7, and C7 on T1,  likely due to underlying spondylosis. Mild spondylolisthesis at T1-2 and T2-3 are also felt to be due to underlying spondylosis. There is moderate disc space narrowing at C4-5 and C5-6 with severe narrowing at C6-7, C7-T1, and T1-2. There is facet osteoarthritic change and hypertrophy at most levels bilaterally. Exit foraminal narrowing is fairly marked at C4-5 on the left, C5-6 bilaterally, and C6-7 bilaterally. There are foci of calcification in both carotid arteries, more on the left than on the right. There is scarring in each lung apex. IMPRESSION: CT head: Acute infarct involving much of the right middle cerebral artery distribution. No acute hemorrhage is evident. There is atrophy with small vessel  disease. No extra-axial fluid collection or midline shift. CT cervical spine: No fracture. Mild spondylolisthesis at multiple levels, felt to be due to extensive underlying spondylosis. There is multilevel spondylosis and osteoarthritic change. There are foci of carotid artery calcification bilaterally, more severe on the left than on the right. Electronically Signed   By: Bretta Bang III M.D.   On: 02/23/2016 08:41   Dg Chest Portable 1 View  02/23/2016  CLINICAL DATA:  Patient found in for bus side bed. Altered mental status. EXAM: PORTABLE CHEST 1 VIEW COMPARISON:  02/16/2016 FINDINGS: The heart size appears normal. Aortic atherosclerosis is identified. Small left pleural effusion is identified. Mild interstitial edema is noted. IMPRESSION: 1. Small left pleural effusion and mild edema suspicious for CHF. Electronically Signed   By: Signa Kell M.D.   On: 02/23/2016 08:16   I have personally reviewed and evaluated these images and lab results as part of my medical decision-making.   EKG Interpretation   Date/Time:  Wednesday Feb 23 2016 07:31:08 EDT Ventricular Rate:  110 PR Interval:  35 QRS Duration: 103 QT Interval:  347 QTC Calculation: 469 R Axis:   94 Text Interpretation:  Sinus  tachycardia Anteroseptal infarct, age  indeterminate Lateral leads are also involved Artifact in lead(s) I III  aVR aVL aVF V1 V2 Confirmed by Rubin Payor  MD, Hayde Kilgour [phone]) on 02/23/2016  7:35:32 AM      MDM   Final diagnoses:  Cerebrovascular accident (CVA) due to occlusion of right middle cerebral artery (HCC)    Patient with altered mental status and left-sided deficits. Likely stroke last night. Not TPA candidate due to unknown time of onset and severe deficits. Vision is overall poor prognosis. Discussed with patient's son and daughter-in-law and grandson, who is a physician at this hospital. Patient was made a DO NOT RESUSCITATE. Admit to internal medicine. Patient did have fever however did have urine that showed no infection and negative chest x-ray.      Benjiman Core, MD 02/23/16 [phone]

## 2016-02-23 NOTE — Consult Note (Signed)
Requesting Physician: Dr. Rubin Payor    Chief Complaint: stroke    HPI:                                                                                                                                         Florabel Faulks is an 82 y.o. female presenting to the [facility] after being found on the floor at her Assisted Living facility, [facility].  Her son reports the last time her say her at her baseline, normal speech and walking with a cane/walker, was yesterday early am.  She was found this morning with garbled speech and Lt side weakness.  Date last known well: Yesterday Time last known well: Time: 08:00 tPA Given: No: Outside window for acute treatment    Past Medical History  Diagnosis Date  . CAD (coronary artery disease)     Cath 2012 right coronary 35-40% stenosis, LAD proximal 20% stenosis followed by mid 40% stenosis  . Mitral regurgitation     Mild  . Vasovagal syncope     Past Surgical History  Procedure Laterality Date  . Thyroidectomy, partial    . Total knee arthroplasty    . Appendectomy    . Corneal transplant      Family History  Problem Relation Age of Onset  . Stroke Mother   . Depression Sister   . Lymphoma Son    Social History:  reports that she has never smoked. She has never used smokeless tobacco. Her alcohol and drug histories are not on file.  Allergies: No Known Allergies  Medications:                                                                                                                           No current facility-administered medications for this encounter.   Current Outpatient Prescriptions  Medication Sig Dispense Refill  . aspirin 81 MG tablet Take 81 mg by mouth daily.    . Calcium-Vitamin D 600-200 MG-UNIT tablet Take 1 tablet by mouth daily.    . Glucos-Chond-Hyal Ac-Ca Fructo (MOVE FREE JOINT HEALTH ADVANCE PO) Take 1 tablet by mouth daily.    Marland Kitchen guaifenesin (ROBITUSSIN) 100 MG/5ML syrup Take 200 mg by mouth 4 (four)  times daily. For cough    . ipratropium-albuterol (DUONEB) 0.5-2.5 (3) MG/3ML SOLN Take 3 mLs by nebulization every 6 (six) hours as needed (Cough  or congestion). 360 mL 3  . levothyroxine (SYNTHROID, LEVOTHROID) 50 MCG tablet Take 50 mcg by mouth daily before breakfast.    . Loratadine-Pseudoephedrine (CLARITIN-D 12 HOUR PO) Take 1 tablet by mouth daily.    . metoprolol tartrate (LOPRESSOR) 25 MG tablet Take 12.5-25 mg by mouth 2 (two) times daily. Take 1/2 tablets in morning and 1 at night    . mirabegron ER (MYRBETRIQ) 50 MG TB24 tablet Take 50 mg by mouth daily.    . Multiple Vitamins-Minerals (MULTIVITAMIN WITH MINERALS) tablet Take 1 tablet by mouth daily.    . nitroGLYCERIN (NITROSTAT) 0.4 MG SL tablet Place 0.4 mg under the tongue every 5 (five) minutes as needed for chest pain.    Marland Kitchen omeprazole (PRILOSEC) 40 MG capsule Take 40 mg by mouth daily.    . prednisoLONE acetate (PRED MILD) 0.12 % ophthalmic suspension Place 1 drop into the right eye at bedtime.    . Prenatal Vit-Fe Fumarate-FA (MULTIVITAMIN-PRENATAL) 27-0.8 MG TABS tablet Take 1 tablet by mouth daily at 12 noon.    . trospium (SANCTURA) 20 MG tablet Take 60 mg by mouth daily.       ROS:                                                                                                                                       History unable to be obtained due to AMS.   Neurologic Examination:                                                                                                      Blood pressure 117/72, pulse 101, temperature 101.4 F (38.6 C), temperature source Rectal, resp. rate 27, height 5\' 2"  (1.575 m), weight 58.968 kg (130 lb), SpO2 93 %.  HEENT-  Normocephalic,    Normal external eye and conjunctiva. Blood on lips and small laceration to tip of tongue Cardiovascular- S1, S2 normal, pulses palpable throughout   Lungs- tachypnea is noted, lungs diminished Abdomen- soft, non-tender; bowel sounds normal; no masses,   no organomegaly Extremities- less then 2 second capillary refill Lymph-no adenopathy palpable Musculoskeletal-no joint tenderness, deformity or swelling Skin-warm and dry, no hyperpigmentation, vitiligo, or suspicious lesions  Neurological Examination Mental Status: Eyes open and attempts at times to follow commands, such as sticking tongue out and attempting to talk. Cranial Nerves: II: No blink to threat on left pupils equal, round, reactive to light and accommodation III,IV, VI: Rt gaze preference does not cross midline  V,VII: face symmetric, no reaction to pain VIII:Unable to assess IX,X: uvula rises symmetrically, gag intact [id number] to assess XII: midline tongue extension Motor: Right : Upper extremity   3/5    Left:     Upper extremity   Held in flexion  Lower extremity   2/5     Lower extremity   Held in extension  Sensory: Withdrawal from pain RLE Deep Tendon Reflexes: 2+ and symmetric throughout UE bilaterally, 2+ RLE, 1+ LLE Plantars: mute Cerebellar: Unable to assess Gait: not tested       Lab Results: Basic Metabolic Panel:  Recent Labs Lab 02/16/16 1313 02/23/16 0748 02/23/16 0757  NA 126* 127* 128*  K 4.3 3.8 3.7  CL 93* 94* 92*  CO2 23 22  --   GLUCOSE 104* 124* 122*  BUN 19 9 11   CREATININE 0.77 0.82 0.60  CALCIUM 8.8* 9.0  --     Liver Function Tests:  Recent Labs Lab 02/23/16 0748  AST 48*  ALT 21  ALKPHOS 61  BILITOT 1.0  PROT 7.3  ALBUMIN 4.0   No results for input(s): LIPASE, AMYLASE in the last 168 hours. No results for input(s): AMMONIA in the last 168 hours.  CBC:  Recent Labs Lab 02/16/16 1313 02/23/16 0748 02/23/16 0757  WBC 6.8 6.6  --   NEUTROABS 4.4 5.2  --   HGB 12.3 13.2 15.3*  HCT 36.1 39.1 45.0  MCV 85.5 84.8  --   PLT 168 177  --     Cardiac Enzymes: No results for input(s): CKTOTAL, CKMB, CKMBINDEX, TROPONINI in the last 168 hours.  Lipid Panel: No results for input(s): CHOL, TRIG, HDL, CHOLHDL,  VLDL, LDLCALC in the last 168 hours.  CBG: No results for input(s): GLUCAP in the last 168 hours.  Microbiology: No results found for this or any previous visit.  Coagulation Studies:  Recent Labs  02/23/16 0748  LABPROT 14.8  INR 1.14    Imaging: Ct Head Wo Contrast  02/23/2016  CLINICAL DATA:  Pain following fall.  Chronic altered mental status EXAM: CT HEAD WITHOUT CONTRAST CT CERVICAL SPINE WITHOUT CONTRAST TECHNIQUE: Multidetector CT imaging of the head and cervical spine was performed following the standard protocol without intravenous contrast. Multiplanar CT image reconstructions of the cervical spine were also generated. COMPARISON:  None. FINDINGS: CT HEAD FINDINGS There is mild diffuse atrophy. There is decreased attenuation throughout the inferior posterior right frontal lobe and essentially the entire right temporal lobe consistent with an acute middle cerebral artery distribution infarct. There is decreased attenuation is well throughout the lateral right basal ganglia region including the right claustrum, extreme capsule, and insular cortex. Localized cytotoxic edema is noted in these regions. There is no mass lesion evident. No hemorrhage or midline shift. No subdural or epidural fluid collections are evident. Elsewhere there is small vessel disease in the centra semiovale bilaterally. The bony calvarium appears intact. The mastoid air cells are clear. There is opacification in the right frontal sinus superiorly as well as mild mucosal thickening in several ethmoid air cells bilaterally. Note that there is diffuse increased attenuation in the right middle cerebral artery compared to the left. CT CERVICAL SPINE FINDINGS There is no demonstrable fracture. There is slight anterolisthesis of C4 on C5, C6 on C7, and C7 on T1, likely due to underlying spondylosis. Mild spondylolisthesis at T1-2 and T2-3 are also felt to be due to underlying spondylosis. There is moderate disc space  narrowing at C4-5 and C5-6 with  severe narrowing at C6-7, C7-T1, and T1-2. There is facet osteoarthritic change and hypertrophy at most levels bilaterally. Exit foraminal narrowing is fairly marked at C4-5 on the left, C5-6 bilaterally, and C6-7 bilaterally. There are foci of calcification in both carotid arteries, more on the left than on the right. There is scarring in each lung apex. IMPRESSION: CT head: Acute infarct involving much of the right middle cerebral artery distribution. No acute hemorrhage is evident. There is atrophy with small vessel disease. No extra-axial fluid collection or midline shift. CT cervical spine: No fracture. Mild spondylolisthesis at multiple levels, felt to be due to extensive underlying spondylosis. There is multilevel spondylosis and osteoarthritic change. There are foci of carotid artery calcification bilaterally, more severe on the left than on the right. Electronically Signed   By: Bretta Bang III M.D.   On: 02/23/2016 08:41   Ct Cervical Spine Wo Contrast  02/23/2016  CLINICAL DATA:  Pain following fall.  Chronic altered mental status EXAM: CT HEAD WITHOUT CONTRAST CT CERVICAL SPINE WITHOUT CONTRAST TECHNIQUE: Multidetector CT imaging of the head and cervical spine was performed following the standard protocol without intravenous contrast. Multiplanar CT image reconstructions of the cervical spine were also generated. COMPARISON:  None. FINDINGS: CT HEAD FINDINGS There is mild diffuse atrophy. There is decreased attenuation throughout the inferior posterior right frontal lobe and essentially the entire right temporal lobe consistent with an acute middle cerebral artery distribution infarct. There is decreased attenuation is well throughout the lateral right basal ganglia region including the right claustrum, extreme capsule, and insular cortex. Localized cytotoxic edema is noted in these regions. There is no mass lesion evident. No hemorrhage or midline shift. No  subdural or epidural fluid collections are evident. Elsewhere there is small vessel disease in the centra semiovale bilaterally. The bony calvarium appears intact. The mastoid air cells are clear. There is opacification in the right frontal sinus superiorly as well as mild mucosal thickening in several ethmoid air cells bilaterally. Note that there is diffuse increased attenuation in the right middle cerebral artery compared to the left. CT CERVICAL SPINE FINDINGS There is no demonstrable fracture. There is slight anterolisthesis of C4 on C5, C6 on C7, and C7 on T1, likely due to underlying spondylosis. Mild spondylolisthesis at T1-2 and T2-3 are also felt to be due to underlying spondylosis. There is moderate disc space narrowing at C4-5 and C5-6 with severe narrowing at C6-7, C7-T1, and T1-2. There is facet osteoarthritic change and hypertrophy at most levels bilaterally. Exit foraminal narrowing is fairly marked at C4-5 on the left, C5-6 bilaterally, and C6-7 bilaterally. There are foci of calcification in both carotid arteries, more on the left than on the right. There is scarring in each lung apex. IMPRESSION: CT head: Acute infarct involving much of the right middle cerebral artery distribution. No acute hemorrhage is evident. There is atrophy with small vessel disease. No extra-axial fluid collection or midline shift. CT cervical spine: No fracture. Mild spondylolisthesis at multiple levels, felt to be due to extensive underlying spondylosis. There is multilevel spondylosis and osteoarthritic change. There are foci of carotid artery calcification bilaterally, more severe on the left than on the right. Electronically Signed   By: Bretta Bang III M.D.   On: 02/23/2016 08:41   Dg Chest Portable 1 View  02/23/2016  CLINICAL DATA:  Patient found in for bus side bed. Altered mental status. EXAM: PORTABLE CHEST 1 VIEW COMPARISON:  02/16/2016 FINDINGS: The heart size appears normal. Aortic atherosclerosis  is  identified. Small left pleural effusion is identified. Mild interstitial edema is noted. IMPRESSION: 1. Small left pleural effusion and mild edema suspicious for CHF. Electronically Signed   By: Signa Kellaylor  Stroud M.D.   On: 02/23/2016 08:16       Assessment and plan discussed with with attending physician and they are in agreement.    Felicie MornDavid Bonnie Overdorf PA-C [facility] [phone]  02/23/2016, 9:55 AM   Assessment: 82 y.o. female presenting with Rt MCA infarct.  Family has decoded to make her palliative.  At this time no further neurological work-up needed.  Call if needed  Stroke Risk Factors - hypertension

## 2016-02-23 NOTE — ED Notes (Signed)
Pt arrives EMS from NH where she was found down beside her bed. NH staff states new onset slurred speech with unknown last time seen normal. Pt has very dry mucous membranes and mumbled speech.

## 2016-02-23 NOTE — H&P (Signed)
History and Physical    Christina Paymentlizabeth Laverdure XLK:[id number] DOB: 1923-03-04 DOA: 02/23/2016  Referring MD/NP/PA: Dr Rubin PayorPickering - [facility] PCP: Pcp Not In System  Outpatient Specialists: None Patient coming from: NH  Chief Complaint: L sided paralysis and found down  HPI: Christina Paymentlizabeth Taboada is a 82 y.o. female with medical history significant of CAD, MR, Hypothyroid, presenting after being found [facility] w/ L sided paralysis. Low 5 caveat applies as patient presenting an obtunded state and unable to provide any history. At this point time patient follows only basic commands. Per report by EDP, nursing home staff and family patient was found down next to her bed this morning. Last known normal was the night before. Patient was incontinent of urine. Patient was weak in her left upper and lower extremities and unable to verbalize. Of note patient was seen and ED one week ago for hypoxia with was thought to be due to a mucous plug. Patient has otherwise been in her normal state of health which has been gradual physical decline. At baseline patient is able to angulate on her own and perform few daily functions.   ED Course: CT showing massive R MCA stroke. Noted to be febrile. UA normal. CXR with small pleural effusions but no overt pneumonia.  Review of Systems: As per HPI otherwise 10 point review of systems negative.   Past Medical History  Diagnosis Date  . CAD (coronary artery disease)     Cath 2012 right coronary 35-40% stenosis, LAD proximal 20% stenosis followed by mid 40% stenosis  . Mitral regurgitation     Mild  . Vasovagal syncope   . Hypothyroidism   . Overactive bladder   . GERD (gastroesophageal reflux disease)     Past Surgical History  Procedure Laterality Date  . Thyroidectomy, partial    . Total knee arthroplasty    . Appendectomy    . Corneal transplant       reports that she has never smoked. She has never used smokeless tobacco. Her alcohol and drug histories are not on  file.  No Known Allergies  Family History  Problem Relation Age of Onset  . Stroke Mother   . Depression Sister   . Lymphoma Son     Prior to Admission medications   Medication Sig Start Date End Date Taking? Authorizing Provider  aspirin 81 MG tablet Take 81 mg by mouth daily.   Yes Historical Provider, MD  Calcium-Vitamin D 600-200 MG-UNIT tablet Take 1 tablet by mouth daily.   Yes Historical Provider, MD  Glucos-Chond-Hyal Ac-Ca Fructo (MOVE FREE JOINT HEALTH ADVANCE PO) Take 1 tablet by mouth daily.   Yes Historical Provider, MD  guaifenesin (ROBITUSSIN) 100 MG/5ML syrup Take 200 mg by mouth 4 (four) times daily. For cough   Yes Historical Provider, MD  ipratropium-albuterol (DUONEB) 0.5-2.5 (3) MG/3ML SOLN Take 3 mLs by nebulization every 6 (six) hours as needed (Cough or congestion). 02/16/16  Yes Mancel BaleElliott Wentz, MD  levothyroxine (SYNTHROID, LEVOTHROID) 50 MCG tablet Take 50 mcg by mouth daily before breakfast.   Yes Historical Provider, MD  Loratadine-Pseudoephedrine (CLARITIN-D 12 HOUR PO) Take 1 tablet by mouth daily.   Yes Historical Provider, MD  metoprolol tartrate (LOPRESSOR) 25 MG tablet Take 12.5-25 mg by mouth 2 (two) times daily. Take 1/2 tablets in morning and 1 at night   Yes Historical Provider, MD  mirabegron ER (MYRBETRIQ) 50 MG TB24 tablet Take 50 mg by mouth daily.   Yes Historical Provider, MD  Multiple  Vitamins-Minerals (MULTIVITAMIN WITH MINERALS) tablet Take 1 tablet by mouth daily.   Yes Historical Provider, MD  nitroGLYCERIN (NITROSTAT) 0.4 MG SL tablet Place 0.4 mg under the tongue every 5 (five) minutes as needed for chest pain.   Yes Historical Provider, MD  omeprazole (PRILOSEC) 40 MG capsule Take 40 mg by mouth daily.   Yes Historical Provider, MD  prednisoLONE acetate (PRED MILD) 0.12 % ophthalmic suspension Place 1 drop into the right eye at bedtime.   Yes Historical Provider, MD  Prenatal Vit-Fe Fumarate-FA (MULTIVITAMIN-PRENATAL) 27-0.8 MG TABS tablet  Take 1 tablet by mouth daily at 12 noon.   Yes Historical Provider, MD  trospium (SANCTURA) 20 MG tablet Take 60 mg by mouth daily.    Yes Historical Provider, MD    Physical Exam: Filed Vitals:   02/23/16 1015 02/23/16 1030 02/23/16 1058 02/23/16 1100  BP: 114/69 119/60 114/69 119/68  Pulse: 101 82 105 110  Temp:      TempSrc:      Resp: 20 20 22 24   Height:      Weight:      SpO2: 96% 96% 95% 92%      Constitutional: Resting in bed. Minimally arousable Filed Vitals:   02/23/16 1015 02/23/16 1030 02/23/16 1058 02/23/16 1100  BP: 114/69 119/60 114/69 119/68  Pulse: 101 82 105 110  Temp:      TempSrc:      Resp: 20 20 22 24   Height:      Weight:      SpO2: 96% 96% 95% 92%   Eyes: *Rightward gaze, pupils reactive. ENMT: Very dry mucous membranes with poor dentition. Neck: No thyromegaly, no JVD Respiratory: Increased respiratory effort, decreased breath sounds in bases bilaterally with few crackles, no wheezing. Cardiovascular: Difficult to appreciate cardiac sounds, regular rate and rhythm, 2/6 systolic murmur, no lower extremity edema, 2+ distal pulses  Abdomen:  no tenderness, no masses palpated. Bowel sounds positive.  Musculoskeletal: Bilateral feet in plantar flexion, contracture of the left upper extremity in fixed flexion, right upper and lower extremities weak but will move. Skin:  no rashes, lesions, ulcers. No induration Neurologic/Psych: Unable to fully assess due to pts current status., LUE and LLE in contracture. Gaze rightward but will not cross MIdline. Non-conversive   Labs on Admission: I have personally reviewed following labs and imaging studies  CBC:  Recent Labs Lab 02/16/16 1313 02/23/16 0748 02/23/16 0757  WBC 6.8 6.6  --   NEUTROABS 4.4 5.2  --   HGB 12.3 13.2 15.3*  HCT 36.1 39.1 45.0  MCV 85.5 84.8  --   PLT 168 177  --    Basic Metabolic Panel:  Recent Labs Lab 02/16/16 1313 02/23/16 0748 02/23/16 0757  NA 126* 127* 128*  K  4.3 3.8 3.7  CL 93* 94* 92*  CO2 23 22  --   GLUCOSE 104* 124* 122*  BUN 19 9 11   CREATININE 0.77 0.82 0.60  CALCIUM 8.8* 9.0  --    GFR: Estimated Creatinine Clearance: 37 mL/min (by C-G formula based on Cr of 0.6). Liver Function Tests:  Recent Labs Lab 02/23/16 0748  AST 48*  ALT 21  ALKPHOS 61  BILITOT 1.0  PROT 7.3  ALBUMIN 4.0   No results for input(s): LIPASE, AMYLASE in the last 168 hours. No results for input(s): AMMONIA in the last 168 hours. Coagulation Profile:  Recent Labs Lab 02/23/16 0748  INR 1.14   Cardiac Enzymes: No results for input(s): CKTOTAL, CKMB,  CKMBINDEX, TROPONINI in the last 168 hours. BNP (last 3 results) No results for input(s): PROBNP in the last 8760 hours. HbA1C: No results for input(s): HGBA1C in the last 72 hours. CBG: No results for input(s): GLUCAP in the last 168 hours. Lipid Profile: No results for input(s): CHOL, HDL, LDLCALC, TRIG, CHOLHDL, LDLDIRECT in the last 72 hours. Thyroid Function Tests: No results for input(s): TSH, T4TOTAL, FREET4, T3FREE, THYROIDAB in the last 72 hours. Anemia Panel: No results for input(s): VITAMINB12, FOLATE, FERRITIN, TIBC, IRON, RETICCTPCT in the last 72 hours. Urine analysis:    Component Value Date/Time   COLORURINE YELLOW 02/23/2016 0830   APPEARANCEUR CLEAR 02/23/2016 0830   LABSPEC 1.009 02/23/2016 0830   PHURINE 7.5 02/23/2016 0830   GLUCOSEU NEGATIVE 02/23/2016 0830   HGBUR MODERATE* 02/23/2016 0830   BILIRUBINUR NEGATIVE 02/23/2016 0830   KETONESUR 15* 02/23/2016 0830   PROTEINUR NEGATIVE 02/23/2016 0830   NITRITE NEGATIVE 02/23/2016 0830   LEUKOCYTESUR NEGATIVE 02/23/2016 0830    Creatinine Clearance: Estimated Creatinine Clearance: 37 mL/min (by C-G formula based on Cr of 0.6).  Sepsis Labs: @LABRCNTIP (procalcitonin:4,lacticidven:4) )No results found for this or any previous visit (from the past 240 hour(s)).   Radiological Exams on Admission: Ct Head Wo  Contrast  02/23/2016  CLINICAL DATA:  Pain following fall.  Chronic altered mental status EXAM: CT HEAD WITHOUT CONTRAST CT CERVICAL SPINE WITHOUT CONTRAST TECHNIQUE: Multidetector CT imaging of the head and cervical spine was performed following the standard protocol without intravenous contrast. Multiplanar CT image reconstructions of the cervical spine were also generated. COMPARISON:  None. FINDINGS: CT HEAD FINDINGS There is mild diffuse atrophy. There is decreased attenuation throughout the inferior posterior right frontal lobe and essentially the entire right temporal lobe consistent with an acute middle cerebral artery distribution infarct. There is decreased attenuation is well throughout the lateral right basal ganglia region including the right claustrum, extreme capsule, and insular cortex. Localized cytotoxic edema is noted in these regions. There is no mass lesion evident. No hemorrhage or midline shift. No subdural or epidural fluid collections are evident. Elsewhere there is small vessel disease in the centra semiovale bilaterally. The bony calvarium appears intact. The mastoid air cells are clear. There is opacification in the right frontal sinus superiorly as well as mild mucosal thickening in several ethmoid air cells bilaterally. Note that there is diffuse increased attenuation in the right middle cerebral artery compared to the left. CT CERVICAL SPINE FINDINGS There is no demonstrable fracture. There is slight anterolisthesis of C4 on C5, C6 on C7, and C7 on T1, likely due to underlying spondylosis. Mild spondylolisthesis at T1-2 and T2-3 are also felt to be due to underlying spondylosis. There is moderate disc space narrowing at C4-5 and C5-6 with severe narrowing at C6-7, C7-T1, and T1-2. There is facet osteoarthritic change and hypertrophy at most levels bilaterally. Exit foraminal narrowing is fairly marked at C4-5 on the left, C5-6 bilaterally, and C6-7 bilaterally. There are foci of  calcification in both carotid arteries, more on the left than on the right. There is scarring in each lung apex. IMPRESSION: CT head: Acute infarct involving much of the right middle cerebral artery distribution. No acute hemorrhage is evident. There is atrophy with small vessel disease. No extra-axial fluid collection or midline shift. CT cervical spine: No fracture. Mild spondylolisthesis at multiple levels, felt to be due to extensive underlying spondylosis. There is multilevel spondylosis and osteoarthritic change. There are foci of carotid artery calcification bilaterally, more severe on  the left than on the right. Electronically Signed   By: Bretta Bang III M.D.   On: 02/23/2016 08:41   Ct Cervical Spine Wo Contrast  02/23/2016  CLINICAL DATA:  Pain following fall.  Chronic altered mental status EXAM: CT HEAD WITHOUT CONTRAST CT CERVICAL SPINE WITHOUT CONTRAST TECHNIQUE: Multidetector CT imaging of the head and cervical spine was performed following the standard protocol without intravenous contrast. Multiplanar CT image reconstructions of the cervical spine were also generated. COMPARISON:  None. FINDINGS: CT HEAD FINDINGS There is mild diffuse atrophy. There is decreased attenuation throughout the inferior posterior right frontal lobe and essentially the entire right temporal lobe consistent with an acute middle cerebral artery distribution infarct. There is decreased attenuation is well throughout the lateral right basal ganglia region including the right claustrum, extreme capsule, and insular cortex. Localized cytotoxic edema is noted in these regions. There is no mass lesion evident. No hemorrhage or midline shift. No subdural or epidural fluid collections are evident. Elsewhere there is small vessel disease in the centra semiovale bilaterally. The bony calvarium appears intact. The mastoid air cells are clear. There is opacification in the right frontal sinus superiorly as well as mild mucosal  thickening in several ethmoid air cells bilaterally. Note that there is diffuse increased attenuation in the right middle cerebral artery compared to the left. CT CERVICAL SPINE FINDINGS There is no demonstrable fracture. There is slight anterolisthesis of C4 on C5, C6 on C7, and C7 on T1, likely due to underlying spondylosis. Mild spondylolisthesis at T1-2 and T2-3 are also felt to be due to underlying spondylosis. There is moderate disc space narrowing at C4-5 and C5-6 with severe narrowing at C6-7, C7-T1, and T1-2. There is facet osteoarthritic change and hypertrophy at most levels bilaterally. Exit foraminal narrowing is fairly marked at C4-5 on the left, C5-6 bilaterally, and C6-7 bilaterally. There are foci of calcification in both carotid arteries, more on the left than on the right. There is scarring in each lung apex. IMPRESSION: CT head: Acute infarct involving much of the right middle cerebral artery distribution. No acute hemorrhage is evident. There is atrophy with small vessel disease. No extra-axial fluid collection or midline shift. CT cervical spine: No fracture. Mild spondylolisthesis at multiple levels, felt to be due to extensive underlying spondylosis. There is multilevel spondylosis and osteoarthritic change. There are foci of carotid artery calcification bilaterally, more severe on the left than on the right. Electronically Signed   By: Bretta Bang III M.D.   On: 02/23/2016 08:41   Dg Chest Portable 1 View  02/23/2016  CLINICAL DATA:  Patient found in for bus side bed. Altered mental status. EXAM: PORTABLE CHEST 1 VIEW COMPARISON:  02/16/2016 FINDINGS: The heart size appears normal. Aortic atherosclerosis is identified. Small left pleural effusion is identified. Mild interstitial edema is noted. IMPRESSION: 1. Small left pleural effusion and mild edema suspicious for CHF. Electronically Signed   By: Signa Kell M.D.   On: 02/23/2016 08:16    Assessment/Plan Active Problems:    Stroke (HCC)   End of life care   Fever   Hyponatremia   Hypothyroidism   GERD (gastroesophageal reflux disease)   Stroke: large ischemic stroke involving a large region of the R MCA distribution. Significant LLE and LUE deficits w/ rightward gaze. Suspect sequele from stroke will lead to pts demise (cerebral edema and herniation vs aspiration vs malnutrition...). Put head of bed up as while I was in the room the pt developed marked  respiratory distress due to managing oral secretions. After elevating the head of bed the pts respirations returned to normal. Suspect pt has hours to days to live. Neuro following.  - Med surge - End of life orders placed, DC routine monitoring and stroke workup orders.  - No further workup, working aggressively w/ CSW for placement at a hospice center.  - Hold home medications  Fever: temp 101.4. UA clear. CXR suggestive of possible early CHF. Pt very dry on exam. Possible Afib on monitor initially though EKG showing sinus. - No further workup as pt is comfort care at this time.   Hyponatremia: 128 on admission. Chronic for pt. No further mgt at this time.    DVT prophylaxis: none  Code Status: DNR  Family Communication: Son and Grandson  Disposition Plan: hospice center  Consults called: social work  Admission status: Obs, hopefully will go to hospice center within a few hours.     Elchanan Bob J MD [facility]  If 7PM-7AM, please contact night-coverage [email or web address] Password [email or web address]  02/23/2016, 11:55 AM

## 2016-02-24 DIAGNOSIS — I63311 Cerebral infarction due to thrombosis of right middle cerebral artery: Secondary | ICD-10-CM | POA: Diagnosis not present

## 2016-02-24 DIAGNOSIS — Z515 Encounter for palliative care: Secondary | ICD-10-CM

## 2016-02-24 DIAGNOSIS — I63511 Cerebral infarction due to unspecified occlusion or stenosis of right middle cerebral artery: Secondary | ICD-10-CM | POA: Diagnosis not present

## 2016-02-24 MED ORDER — LORAZEPAM 2 MG/ML PO CONC: 1.0000 mg | ORAL | Status: AC | PRN

## 2016-02-24 MED ORDER — GLYCOPYRROLATE 0.2 MG/ML IJ SOLN: 0.2000 mg | INTRAMUSCULAR | Status: AC | PRN

## 2016-02-24 NOTE — Progress Notes (Signed)
[facility] [employer]   Received request for family interest in [facility]. Chart reviewed.  [facility] available today. Met with son, Thayer Jew to complete paperwork for transfer today.  ? Please fax discharge summary to [phone]. ? RN please call report to [phone]. ? Please arrange transport for as early in day as possible.  ? Thank you, Freddi Starr RN, [facility] Liaison [phone]

## 2016-02-24 NOTE — Discharge Summary (Signed)
Physician Discharge Summary  Christina Ali ZOX:[id number] DOB: 1922-11-03 DOA: 02/23/2016  PCP: Pcp Not In System  Admit date: 02/23/2016 Discharge date: 02/24/2016   Recommendations for Outpatient Follow-Up:   1. TO [facility] for End of life care   Discharge Diagnosis:   Active Problems:   Stroke (HCC)   End of life care   Fever   Hyponatremia   Hypothyroidism   GERD (gastroesophageal reflux disease)   Discharge disposition:  [facility]  Discharge Condition: terminal  Diet recommendation: NPO  Wound care: None.   History of Present Illness:   Christina Ali is a 82 y.o. female with medical history significant of CAD, MR, Hypothyroid, presenting after being found down at [facility] w/ L sided paralysis. Low 5 caveat applies as patient presenting an obtunded state and unable to provide any history. At this point time patient follows only basic commands. Per report by EDP, nursing home staff and family patient was found down next to her bed this morning. Last known normal was the night before. Patient was incontinent of urine. Patient was weak in her left upper and lower extremities and unable to verbalize. Of note patient was seen and ED one week ago for hypoxia with was thought to be due to a mucous plug. Patient has otherwise been in her normal state of health which has been gradual physical decline. At baseline patient is able to angulate on her own and perform few daily functions.   Hospital Course by Problem:   Stroke: large ischemic stroke involving a large region of the R MCA distribution.  -Suspect pt has hours to days to live - End of life orders placed -holding on morphine gtt as patient does not appear to be in distress and family awaiting a son from [place]  Fever: - No further workup as pt is comfort care at this time.  -tylenol suppositories  Hyponatremia: 128 on admission. Chronic for pt. No further mgt at this time.      Medical Consultants:     neuro   Discharge Exam:   Filed Vitals:   02/23/16 1416 02/24/16 0138  BP: 130/80 126/74  Pulse: 92 93  Temp: 98.1 F (36.7 C) 99 F (37.2 C)  Resp: 24 19   Filed Vitals:   02/23/16 1330 02/23/16 1345 02/23/16 1416 02/24/16 0138  BP: 121/54 109/78 130/80 126/74  Pulse: 103 99 92 93  Temp:   98.1 F (36.7 C) 99 F (37.2 C)  TempSrc:   Axillary Oral  Resp: Height:      Weight:      SpO2: 96%  97% 99%      The results of significant diagnostics from this hospitalization (including imaging, microbiology, ancillary and laboratory) are listed below for reference.     Procedures and Diagnostic Studies:   Ct Head Wo Contrast  02/23/2016  CLINICAL DATA:  Pain following fall.  Chronic altered mental status EXAM: CT HEAD WITHOUT CONTRAST CT CERVICAL SPINE WITHOUT CONTRAST TECHNIQUE: Multidetector CT imaging of the head and cervical spine was performed following the standard protocol without intravenous contrast. Multiplanar CT image reconstructions of the cervical spine were also generated. COMPARISON:  None. FINDINGS: CT HEAD FINDINGS There is mild diffuse atrophy. There is decreased attenuation throughout the inferior posterior right frontal lobe and essentially the entire right temporal lobe consistent with an acute middle cerebral artery distribution infarct. There is decreased attenuation is well throughout the lateral right basal ganglia region including the  right claustrum, extreme capsule, and insular cortex. Localized cytotoxic edema is noted in these regions. There is no mass lesion evident. No hemorrhage or midline shift. No subdural or epidural fluid collections are evident. Elsewhere there is small vessel disease in the centra semiovale bilaterally. The bony calvarium appears intact. The mastoid air cells are clear. There is opacification in the right frontal sinus superiorly as well as mild mucosal thickening in several ethmoid air cells bilaterally. Note that  there is diffuse increased attenuation in the right middle cerebral artery compared to the left. CT CERVICAL SPINE FINDINGS There is no demonstrable fracture. There is slight anterolisthesis of C4 on C5, C6 on C7, and C7 on T1, likely due to underlying spondylosis. Mild spondylolisthesis at T1-2 and T2-3 are also felt to be due to underlying spondylosis. There is moderate disc space narrowing at C4-5 and C5-6 with severe narrowing at C6-7, C7-T1, and T1-2. There is facet osteoarthritic change and hypertrophy at most levels bilaterally. Exit foraminal narrowing is fairly marked at C4-5 on the left, C5-6 bilaterally, and C6-7 bilaterally. There are foci of calcification in both carotid arteries, more on the left than on the right. There is scarring in each lung apex. IMPRESSION: CT head: Acute infarct involving much of the right middle cerebral artery distribution. No acute hemorrhage is evident. There is atrophy with small vessel disease. No extra-axial fluid collection or midline shift. CT cervical spine: No fracture. Mild spondylolisthesis at multiple levels, felt to be due to extensive underlying spondylosis. There is multilevel spondylosis and osteoarthritic change. There are foci of carotid artery calcification bilaterally, more severe on the left than on the right. Electronically Signed   By: Bretta Bang III M.D.   On: 02/23/2016 08:41   Ct Cervical Spine Wo Contrast  02/23/2016  CLINICAL DATA:  Pain following fall.  Chronic altered mental status EXAM: CT HEAD WITHOUT CONTRAST CT CERVICAL SPINE WITHOUT CONTRAST TECHNIQUE: Multidetector CT imaging of the head and cervical spine was performed following the standard protocol without intravenous contrast. Multiplanar CT image reconstructions of the cervical spine were also generated. COMPARISON:  None. FINDINGS: CT HEAD FINDINGS There is mild diffuse atrophy. There is decreased attenuation throughout the inferior posterior right frontal lobe and essentially  the entire right temporal lobe consistent with an acute middle cerebral artery distribution infarct. There is decreased attenuation is well throughout the lateral right basal ganglia region including the right claustrum, extreme capsule, and insular cortex. Localized cytotoxic edema is noted in these regions. There is no mass lesion evident. No hemorrhage or midline shift. No subdural or epidural fluid collections are evident. Elsewhere there is small vessel disease in the centra semiovale bilaterally. The bony calvarium appears intact. The mastoid air cells are clear. There is opacification in the right frontal sinus superiorly as well as mild mucosal thickening in several ethmoid air cells bilaterally. Note that there is diffuse increased attenuation in the right middle cerebral artery compared to the left. CT CERVICAL SPINE FINDINGS There is no demonstrable fracture. There is slight anterolisthesis of C4 on C5, C6 on C7, and C7 on T1, likely due to underlying spondylosis. Mild spondylolisthesis at T1-2 and T2-3 are also felt to be due to underlying spondylosis. There is moderate disc space narrowing at C4-5 and C5-6 with severe narrowing at C6-7, C7-T1, and T1-2. There is facet osteoarthritic change and hypertrophy at most levels bilaterally. Exit foraminal narrowing is fairly marked at C4-5 on the left, C5-6 bilaterally, and C6-7 bilaterally. There are foci  of calcification in both carotid arteries, more on the left than on the right. There is scarring in each lung apex. IMPRESSION: CT head: Acute infarct involving much of the right middle cerebral artery distribution. No acute hemorrhage is evident. There is atrophy with small vessel disease. No extra-axial fluid collection or midline shift. CT cervical spine: No fracture. Mild spondylolisthesis at multiple levels, felt to be due to extensive underlying spondylosis. There is multilevel spondylosis and osteoarthritic change. There are foci of carotid artery  calcification bilaterally, more severe on the left than on the right. Electronically Signed   By: Bretta BangWilliam  Woodruff III M.D.   On: 02/23/2016 08:41   Dg Chest Portable 1 View  02/23/2016  CLINICAL DATA:  Patient found in for bus side bed. Altered mental status. EXAM: PORTABLE CHEST 1 VIEW COMPARISON:  02/16/2016 FINDINGS: The heart size appears normal. Aortic atherosclerosis is identified. Small left pleural effusion is identified. Mild interstitial edema is noted. IMPRESSION: 1. Small left pleural effusion and mild edema suspicious for CHF. Electronically Signed   By: Signa Kellaylor  Stroud M.D.   On: 02/23/2016 08:16     Labs:   Basic Metabolic Panel:  Recent Labs Lab 02/23/16 0748 02/23/16 0757  NA 127* 128*  K 3.8 3.7  CL 94* 92*  CO2 22  --   GLUCOSE 124* 122*  BUN 9 11  CREATININE 0.82 0.60  CALCIUM 9.0  --    GFR Estimated Creatinine Clearance: 37 mL/min (by C-G formula based on Cr of 0.6). Liver Function Tests:  Recent Labs Lab 02/23/16 0748  AST 48*  ALT 21  ALKPHOS 61  BILITOT 1.0  PROT 7.3  ALBUMIN 4.0   No results for input(s): LIPASE, AMYLASE in the last 168 hours. No results for input(s): AMMONIA in the last 168 hours. Coagulation profile  Recent Labs Lab 02/23/16 0748  INR 1.14    CBC:  Recent Labs Lab 02/23/16 0748 02/23/16 0757  WBC 6.6  --   NEUTROABS 5.2  --   HGB 13.2 15.3*  HCT 39.1 45.0  MCV 84.8  --   PLT 177  --    Cardiac Enzymes: No results for input(s): CKTOTAL, CKMB, CKMBINDEX, TROPONINI in the last 168 hours. BNP: Invalid input(s): POCBNP CBG: No results for input(s): GLUCAP in the last 168 hours. D-Dimer No results for input(s): DDIMER in the last 72 hours. Hgb A1c No results for input(s): HGBA1C in the last 72 hours. Lipid Profile No results for input(s): CHOL, HDL, LDLCALC, TRIG, CHOLHDL, LDLDIRECT in the last 72 hours. Thyroid function studies No results for input(s): TSH, T4TOTAL, T3FREE, THYROIDAB in the last 72  hours.  Invalid input(s): FREET3 Anemia work up No results for input(s): VITAMINB12, FOLATE, FERRITIN, TIBC, IRON, RETICCTPCT in the last 72 hours. Microbiology No results found for this or any previous visit (from the past 240 hour(s)).   Discharge Instructions:       Discharge Instructions    Discharge instructions    Complete by:  As directed   Comfort care-- family wants to wait for morphine gtt until [place] from [place]            Medication List    STOP taking these medications        aspirin 81 MG tablet     Calcium-Vitamin D 600-200 MG-UNIT tablet     CLARITIN-D 12 HOUR PO     guaifenesin 100 MG/5ML syrup  Commonly known as:  ROBITUSSIN     ipratropium-albuterol 0.5-2.5 (  3) MG/3ML Soln  Commonly known as:  DUONEB     levothyroxine 50 MCG tablet  Commonly known as:  SYNTHROID, LEVOTHROID     metoprolol tartrate 25 MG tablet  Commonly known as:  LOPRESSOR     mirabegron ER 50 MG Tb24 tablet  Commonly known as:  MYRBETRIQ     MOVE FREE JOINT HEALTH ADVANCE PO     multivitamin with minerals tablet     multivitamin-prenatal 27-0.8 MG Tabs tablet     nitroGLYCERIN 0.4 MG SL tablet  Commonly known as:  NITROSTAT     omeprazole 40 MG capsule  Commonly known as:  PRILOSEC     prednisoLONE acetate 0.12 % ophthalmic suspension  Commonly known as:  PRED MILD     trospium 20 MG tablet  Commonly known as:  SANCTURA      TAKE these medications        glycopyrrolate 0.2 MG/ML injection  Commonly known as:  ROBINUL  Inject 1 mL (0.2 mg total) into the skin every 4 (four) hours as needed (excessive secretions).     LORazepam 2 MG/ML concentrated solution  Commonly known as:  ATIVAN  Place 0.5 mLs (1 mg total) under the tongue every 4 (four) hours as needed for anxiety.          Time coordinating discharge: 35 min  Signed:  Myron Lona U Mariachristina Holle   [facility] 02/24/2016, 9:31 AM

## 2016-02-24 NOTE — Progress Notes (Signed)
Nutrition Brief Note  Chart reviewed. Pt now transitioning to comfort care.  No further nutrition interventions warranted at this time.  Please re-consult as needed.   Greyson Peavy A. Brittnie Lewey, RD, LDN, CDE Pager: [phone] After hours Pager: [phone]  

## 2016-02-24 NOTE — Clinical Social Work Note (Signed)
Clinical Social Worker notified by [facility] liaison, [place] that admission paperwork is completed and [facility].  Clinical Social Worker facilitated patient discharge including contacting patient family and facility to confirm patient discharge plans.  Clinical information faxed to facility and family agreeable with plan.  CSW arranged ambulance transport via [facility] to [facility].  RN to call report prior to discharge.  Clinical Social Worker will sign off for now as social work intervention is no longer needed. Please consult us again if new need arises.  Derenda FennelBashira Manny Vitolo, MSW, LCSWA [phone] 02/24/2016 10:45 AM

## 2016-02-28 DIAGNOSIS — I63511 Cerebral infarction due to unspecified occlusion or stenosis of right middle cerebral artery: Secondary | ICD-10-CM | POA: Insufficient documentation

## 2016-04-14 ENCOUNTER — Ambulatory Visit: Payer: Medicare Other | Admitting: Cardiology

## 2016-04-18 ENCOUNTER — Encounter: Payer: Medicare Other | Admitting: Cardiology

## 2016-04-18 NOTE — Progress Notes (Signed)
Deceased  This encounter was created in error - please disregard. 

## 2017-12-02 IMAGING — CT CT HEAD W/O CM
4 of 7 series · 14 of 47 positions shown, 15 images · non-contrast
Comparison: None.

CLINICAL DATA: Pain following fall.  Chronic altered mental status

EXAM:
CT HEAD WITHOUT CONTRAST
CT CERVICAL SPINE WITHOUT CONTRAST
TECHNIQUE: Multidetector CT imaging of the head and cervical spine was
performed following the standard protocol without intravenous
contrast. Multiplanar CT image reconstructions of the cervical spine
were also generated.

[Series 2: head without · axial · non-contrast · 0.46mm/px · z∈[-54,+1]mm · 2 of 35 slices shown, 3 images]
[im 12/35  brain]
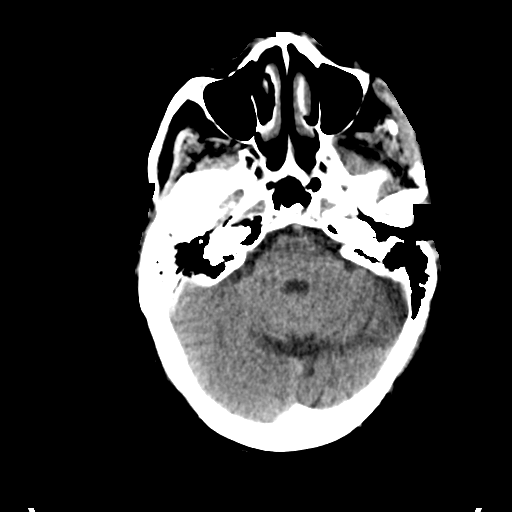
[im 12/35  bone]
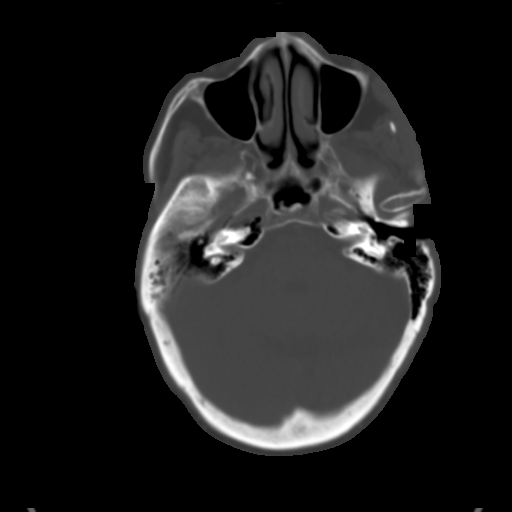
[im 23/35  brain]
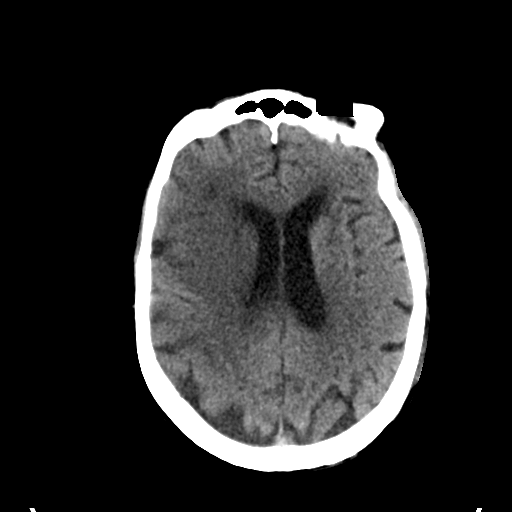

[Series 3: head bone · axial · 0.46mm/px · z∈[-85,+41]mm · 6 of 89 slices shown]
[im 13/89  bone]
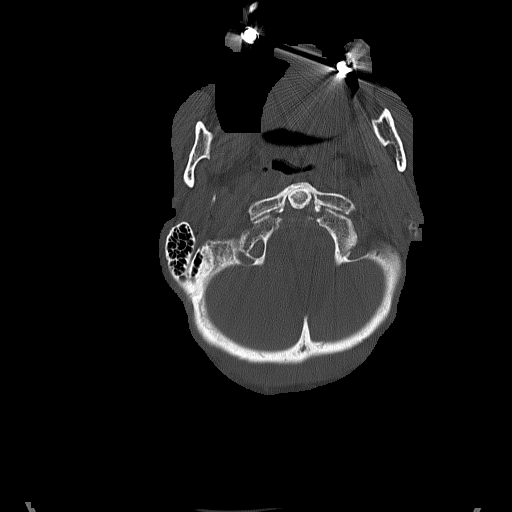
[im 26/89  bone]
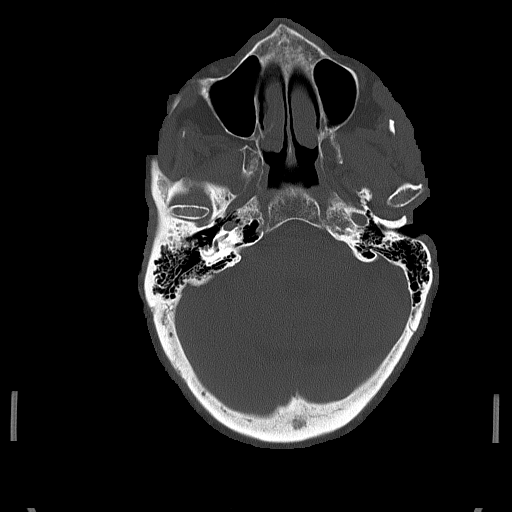
[im 38/89  bone]
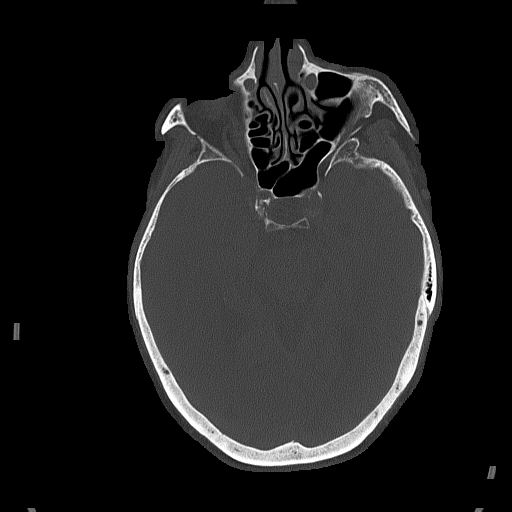
[im 51/89  bone]
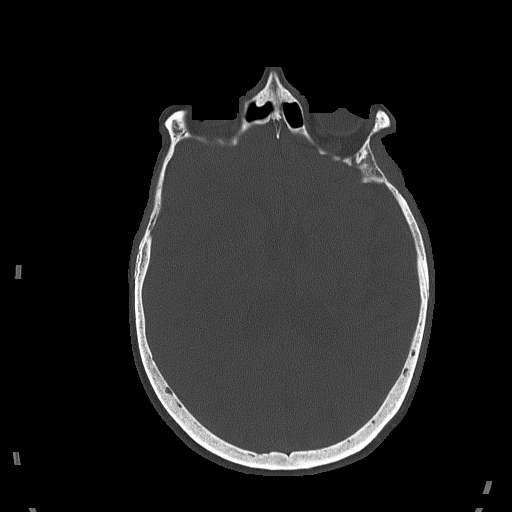
[im 63/89  bone]
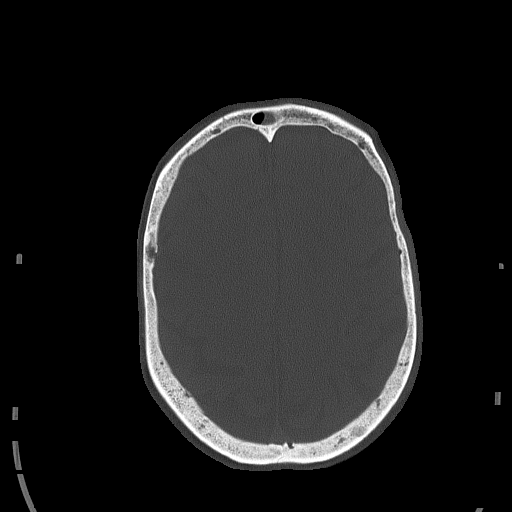
[im 76/89  bone]
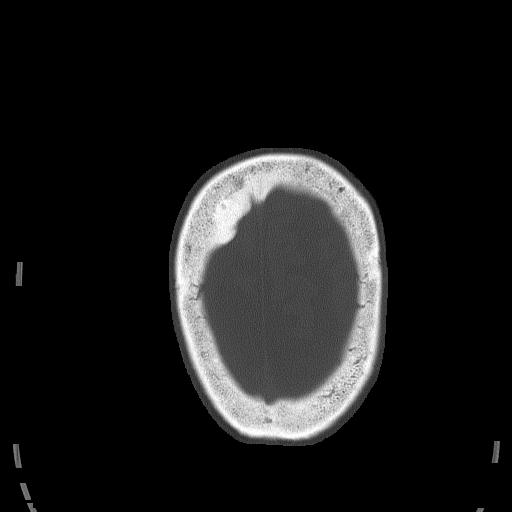

[Series 7: c_spine 2.0 sag bone · sagittal · 0.28mm/px · 3 of 61 slices shown]
[im 21/61  brain]
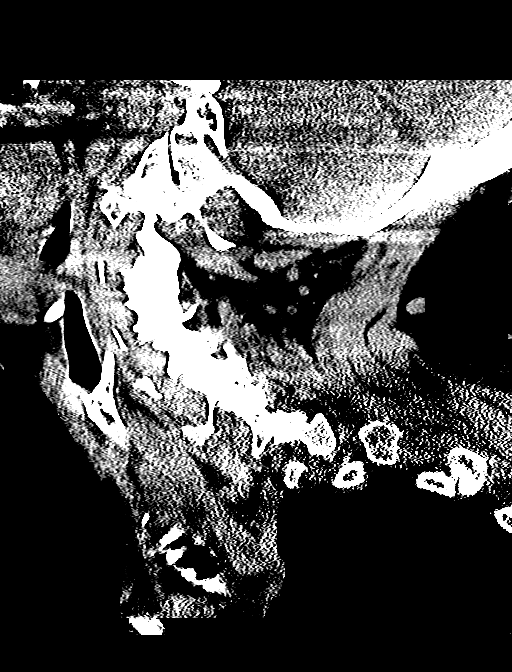
[im 31/61  brain]
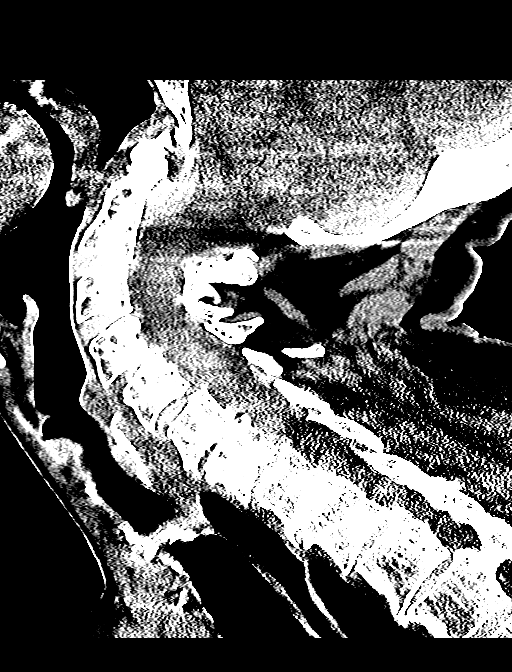
[im 41/61  brain]
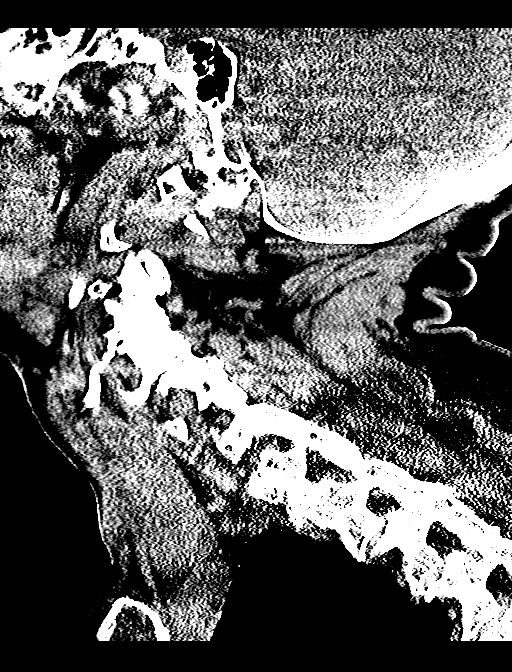

[Series 8: c_spine 2.0 cor bone · coronal · 0.25mm/px · 3 of 50 slices shown]
[im 17/50  brain]
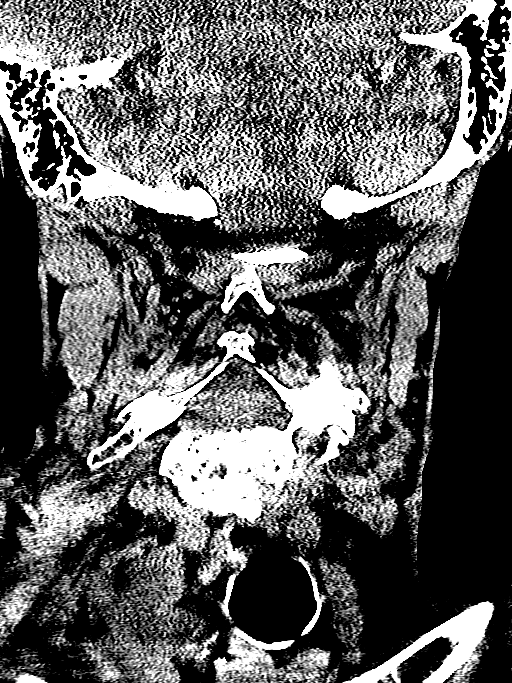
[im 22/50  brain]
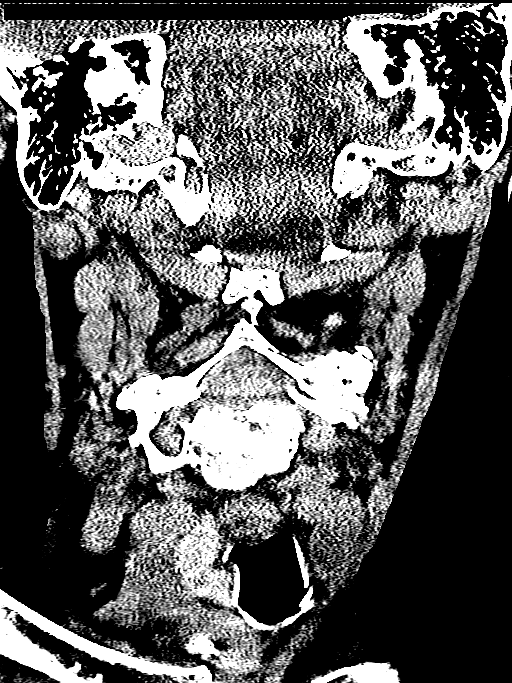
[im 28/50  brain]
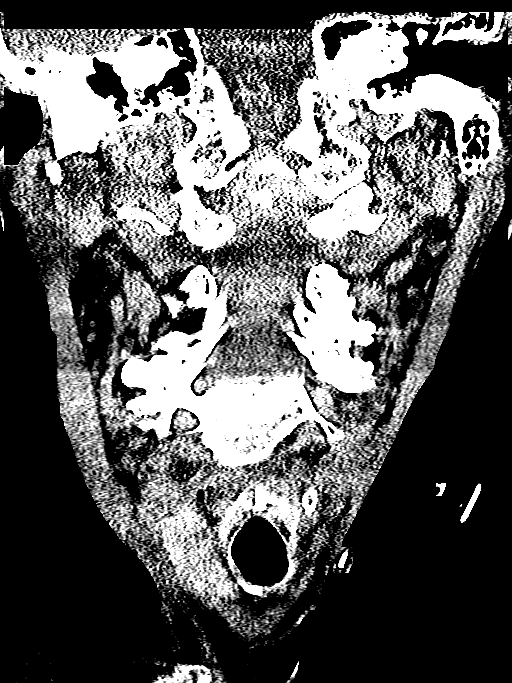

[14 of 47 positions shown; findings below may reference images not displayed]

FINDINGS: CT HEAD FINDINGS

There is mild diffuse atrophy. There is decreased attenuation
throughout the inferior posterior right frontal lobe and essentially
the entire right temporal lobe consistent with an acute middle
cerebral artery distribution infarct. There is decreased attenuation
is well throughout the lateral right basal ganglia region including
the right claustrum, extreme capsule, and insular cortex. Localized
cytotoxic edema is noted in these regions. There is no mass lesion
evident. No hemorrhage or midline shift. No subdural or epidural
fluid collections are evident. Elsewhere there is small vessel
disease in the centra semiovale bilaterally. The bony calvarium
appears intact. The mastoid air cells are clear. There is
opacification in the right frontal sinus superiorly as well as mild
mucosal thickening in several ethmoid air cells bilaterally.

Note that there is diffuse increased attenuation in the right middle
cerebral artery compared to the left.

CT CERVICAL SPINE FINDINGS

There is no demonstrable fracture. There is slight anterolisthesis
of C4 on C5, C6 on C7, and C7 on T1, likely due to underlying
spondylosis. Mild spondylolisthesis at T1-2 and T2-3 are also felt
to be due to underlying spondylosis. There is moderate disc space
narrowing at C4-5 and C5-6 with severe narrowing at C6-7, C7-T1, and
T1-2. There is facet osteoarthritic change and hypertrophy at most
levels bilaterally. Exit foraminal narrowing is fairly marked at
C4-5 on the left, C5-6 bilaterally, and C6-7 bilaterally. There are
foci of calcification in both carotid arteries, more on the left
than on the right. There is scarring in each lung apex.
IMPRESSION: CT head: Acute infarct involving much of the right middle cerebral
artery distribution. No acute hemorrhage is evident. There is
atrophy with small vessel disease. No extra-axial fluid collection
or midline shift.

CT cervical spine: No fracture. Mild spondylolisthesis at multiple
levels, felt to be due to extensive underlying spondylosis. There is
multilevel spondylosis and osteoarthritic change. There are foci of
carotid artery calcification bilaterally, more severe on the left
than on the right.
